# Patient Record
Sex: Male | Born: 1945 | Race: White | Hispanic: No | Marital: Single | Smoking: Never smoker
Health system: Southern US, Community
[De-identification: ages and names within clinical notes are randomized; demographics above are authoritative.]

## PROBLEM LIST (undated history)

## (undated) DIAGNOSIS — Z8709 Personal history of other diseases of the respiratory system: Secondary | ICD-10-CM

## (undated) DIAGNOSIS — M1990 Unspecified osteoarthritis, unspecified site: Secondary | ICD-10-CM

## (undated) DIAGNOSIS — N400 Enlarged prostate without lower urinary tract symptoms: Secondary | ICD-10-CM

## (undated) DIAGNOSIS — M2550 Pain in unspecified joint: Secondary | ICD-10-CM

## (undated) DIAGNOSIS — I10 Essential (primary) hypertension: Secondary | ICD-10-CM

## (undated) DIAGNOSIS — K635 Polyp of colon: Secondary | ICD-10-CM

## (undated) DIAGNOSIS — I2510 Atherosclerotic heart disease of native coronary artery without angina pectoris: Secondary | ICD-10-CM

## (undated) DIAGNOSIS — E785 Hyperlipidemia, unspecified: Secondary | ICD-10-CM

## (undated) HISTORY — DX: Essential (primary) hypertension: I10

## (undated) HISTORY — DX: Polyp of colon: K63.5

## (undated) HISTORY — PX: COLONOSCOPY: SHX174

---

## 2005-07-21 ENCOUNTER — Emergency Department (HOSPITAL_COMMUNITY): Admission: EM | Admit: 2005-07-21 | Discharge: 2005-07-21 | Payer: Self-pay | Admitting: Family Medicine

## 2013-10-21 HISTORY — PX: EYE SURGERY: SHX253

## 2015-08-16 ENCOUNTER — Other Ambulatory Visit (HOSPITAL_COMMUNITY): Payer: Self-pay | Admitting: Orthopedic Surgery

## 2015-09-11 NOTE — Pre-Procedure Instructions (Signed)
Scott MurphyFranklin Johnson  09/11/2015     No Pharmacies Listed   Your procedure is scheduled on Tues, Dec 6 @ 7:30 AM   Report to [facility] Admitting at 5:30 AM   Call this number if you have problems the morning of surgery:  [phone]   Remember:  Do not eat food or drink liquids after midnight .  Take these medicines the morning of surgery with A SIP OF WATER  none              No Goody's,BC's,Aleve,Aspirin,Ibuprofen,Motrin,Advil,Fish Oil,or any Herbal Medications.    Do not wear jewelry.  Do not wear lotions, powders, or colognes.    Men may shave face and neck.   Do not bring valuables to the hospital.  [facility] is not responsible for any belongings or valuables.  Contacts, dentures or bridgework may not be worn into surgery.  Leave your suitcase in the car.  After surgery it may be brought to your room.  For patients admitted to the hospital, discharge time will be determined by your treatment team.     Special instructions:  [place] - Preparing for Surgery  Before surgery, you can play an important role.  Because skin is not sterile, your skin needs to be as free of germs as possible.  You can reduce the number of germs on you skin by washing with CHG (chlorahexidine gluconate) soap before surgery.  CHG is an antiseptic cleaner which kills germs and bonds with the skin to continue killing germs even after washing.  Please DO NOT use if you have an allergy to CHG or antibacterial soaps.  If your skin becomes reddened/irritated stop using the CHG and inform your nurse when you arrive at Short Stay.  Do not shave (including legs and underarms) for at least 48 hours prior to the first CHG shower.  You may shave your face.  Please follow these instructions carefully:   1.  Shower with CHG Soap the night before surgery and the   morning of Surgery.  2.  If you choose to wash your hair, wash your hair first as usual with your  normal shampoo.  3.   After you shampoo, rinse your hair and body thoroughly to remove the  Shampoo.  4.  Use CHG as you would any other liquid soap.  You can apply chg directly to the skin and wash gently with scrungie or a clean washcloth.  5.  Apply the CHG Soap to your body ONLY FROM THE NECK DOWN.        Do not use on open wounds or open sores.  Avoid contact with your eyes  ears, mouth and genitals (private parts).  Wash genitals (private parts  with your normal soap.  6.  Wash thoroughly, paying special attention to the area where your surgery will be performed.  7.  Thoroughly rinse your body with warm water from the neck down.  8.  DO NOT shower/wash with your normal soap after using and rinsing off  the CHG Soap.  9.  Pat yourself dry with a clean towel.            10.  Wear clean pajamas.            11.  Place clean sheets on your bed the night of your first shower and do not sleep with pets.  Day of Surgery  Do not apply any lotions/deoderants the morning of surgery.  Please  wear clean clothes to the hospital/surgery center.    Please read over the following fact sheets that you were given. Pain Booklet, Coughing and Deep Breathing, Blood Transfusion Information, MRSA Information and Surgical Site Infection Prevention

## 2015-09-12 ENCOUNTER — Ambulatory Visit (HOSPITAL_COMMUNITY)
Admission: RE | Admit: 2015-09-12 | Discharge: 2015-09-12 | Disposition: A | Discharge status: Home / Self Care | Payer: BC Managed Care – PPO | Source: Ambulatory Visit | Attending: Orthopedic Surgery | Admitting: Orthopedic Surgery

## 2015-09-12 ENCOUNTER — Encounter (HOSPITAL_COMMUNITY)
Admission: RE | Admit: 2015-09-12 | Discharge: 2015-09-12 | Disposition: A | Discharge status: Home / Self Care | Payer: BC Managed Care – PPO | Source: Ambulatory Visit | Attending: Orthopedic Surgery | Admitting: Orthopedic Surgery

## 2015-09-12 ENCOUNTER — Encounter (HOSPITAL_COMMUNITY): Payer: Self-pay

## 2015-09-12 DIAGNOSIS — Z01812 Encounter for preprocedural laboratory examination: Secondary | ICD-10-CM | POA: Insufficient documentation

## 2015-09-12 DIAGNOSIS — R001 Bradycardia, unspecified: Secondary | ICD-10-CM | POA: Insufficient documentation

## 2015-09-12 DIAGNOSIS — I517 Cardiomegaly: Secondary | ICD-10-CM | POA: Diagnosis not present

## 2015-09-12 DIAGNOSIS — Z01818 Encounter for other preprocedural examination: Secondary | ICD-10-CM

## 2015-09-12 DIAGNOSIS — Z01810 Encounter for preprocedural cardiovascular examination: Secondary | ICD-10-CM | POA: Diagnosis not present

## 2015-09-12 DIAGNOSIS — I498 Other specified cardiac arrhythmias: Secondary | ICD-10-CM | POA: Diagnosis not present

## 2015-09-12 DIAGNOSIS — I10 Essential (primary) hypertension: Secondary | ICD-10-CM | POA: Insufficient documentation

## 2015-09-12 HISTORY — DX: Essential (primary) hypertension: I10

## 2015-09-12 HISTORY — DX: Unspecified osteoarthritis, unspecified site: M19.90

## 2015-09-12 HISTORY — DX: Benign prostatic hyperplasia without lower urinary tract symptoms: N40.0

## 2015-09-12 HISTORY — DX: Hyperlipidemia, unspecified: E78.5

## 2015-09-12 LAB — BASIC METABOLIC PANEL
Anion gap: 9 (ref 5–15)
BUN: 16 mg/dL (ref 6–20)
CALCIUM: 9.7 mg/dL (ref 8.9–10.3)
CO2: 24 mmol/L (ref 22–32)
CREATININE: 1.22 mg/dL (ref 0.61–1.24)
Chloride: 105 mmol/L (ref 101–111)
GFR calc Af Amer: >60 mL/min (ref >60)
GFR, EST NON AFRICAN AMERICAN: 59 mL/min — AB (ref >60)
GLUCOSE: 113 mg/dL — AB (ref 65–99)
Potassium: 3.7 mmol/L (ref 3.5–5.1)
Sodium: 138 mmol/L (ref 135–145)

## 2015-09-12 LAB — URINALYSIS, ROUTINE W REFLEX MICROSCOPIC
BILIRUBIN URINE: NEGATIVE
Glucose, UA: NEGATIVE mg/dL
HGB URINE DIPSTICK: NEGATIVE
Ketones, ur: NEGATIVE mg/dL
Leukocytes, UA: NEGATIVE
Nitrite: NEGATIVE
PH: 5.5 (ref 5.0–8.0)
Protein, ur: NEGATIVE mg/dL
SPECIFIC GRAVITY, URINE: 1.021 (ref 1.005–1.030)

## 2015-09-12 LAB — CBC
HCT: 46.3 % (ref 39.0–52.0)
Hemoglobin: 16.6 g/dL (ref 13.0–17.0)
MCH: 31.9 pg (ref 26.0–34.0)
MCHC: 35.9 g/dL (ref 30.0–36.0)
MCV: 88.9 fL (ref 78.0–100.0)
PLATELETS: 237 10*3/uL (ref 150–400)
RBC: 5.21 MIL/uL (ref 4.22–5.81)
RDW: 13.6 % (ref 11.5–15.5)
WBC: 10.6 10*3/uL — ABNORMAL HIGH (ref 4.0–10.5)

## 2015-09-12 LAB — ABO/RH: ABO/RH(D): O POS

## 2015-09-12 LAB — TYPE AND SCREEN
ABO/RH(D): O POS
ANTIBODY SCREEN: NEGATIVE

## 2015-09-12 LAB — SURGICAL PCR SCREEN
MRSA, PCR: NEGATIVE
STAPHYLOCOCCUS AUREUS: NEGATIVE

## 2015-09-12 NOTE — Progress Notes (Signed)
This patient has scored at an elevated risk for obstructive sleep apnea using the STOP BANG TOOL during a pre-surgical visit. A score of 5 or greater is considered an elevated risk.

## 2015-09-13 LAB — URINE CULTURE: CULTURE: NO GROWTH

## 2015-09-26 ENCOUNTER — Inpatient Hospital Stay (HOSPITAL_COMMUNITY)
Admission: RE | Admit: 2015-09-26 | Discharge: 2015-09-30 | DRG: 470 | Disposition: A | Discharge status: Home Health | Payer: BC Managed Care – PPO | Source: Ambulatory Visit | Attending: Orthopedic Surgery | Admitting: Orthopedic Surgery

## 2015-09-26 ENCOUNTER — Inpatient Hospital Stay (HOSPITAL_COMMUNITY): Payer: BC Managed Care – PPO | Admitting: Certified Registered Nurse Anesthetist

## 2015-09-26 ENCOUNTER — Encounter (HOSPITAL_COMMUNITY)
Admission: RE | Disposition: A | Discharge status: Home Health | Payer: Self-pay | Source: Ambulatory Visit | Attending: Orthopedic Surgery

## 2015-09-26 ENCOUNTER — Encounter (HOSPITAL_COMMUNITY): Payer: Self-pay | Admitting: *Deleted

## 2015-09-26 DIAGNOSIS — M1711 Unilateral primary osteoarthritis, right knee: Secondary | ICD-10-CM | POA: Diagnosis present

## 2015-09-26 DIAGNOSIS — N400 Enlarged prostate without lower urinary tract symptoms: Secondary | ICD-10-CM | POA: Diagnosis present

## 2015-09-26 DIAGNOSIS — Z9842 Cataract extraction status, left eye: Secondary | ICD-10-CM

## 2015-09-26 DIAGNOSIS — Z9841 Cataract extraction status, right eye: Secondary | ICD-10-CM | POA: Diagnosis not present

## 2015-09-26 DIAGNOSIS — Z880 Allergy status to penicillin: Secondary | ICD-10-CM

## 2015-09-26 DIAGNOSIS — I10 Essential (primary) hypertension: Secondary | ICD-10-CM | POA: Diagnosis present

## 2015-09-26 DIAGNOSIS — E785 Hyperlipidemia, unspecified: Secondary | ICD-10-CM | POA: Diagnosis present

## 2015-09-26 HISTORY — PX: TOTAL KNEE ARTHROPLASTY: SHX125

## 2015-09-26 SURGERY — ARTHROPLASTY, KNEE, TOTAL
Anesthesia: Regional | Site: Knee | Laterality: Right

## 2015-09-26 MED ORDER — WARFARIN - PHARMACIST DOSING INPATIENT
Freq: Every day | Status: DC
  Administered 2015-09-26 – 2015-09-29 (×3)

## 2015-09-26 MED ORDER — SODIUM CHLORIDE 0.9 % IR SOLN
Status: DC | PRN
  Administered 2015-09-26: 3000 mL

## 2015-09-26 MED ORDER — OXYCODONE HCL 5 MG PO TABS: 5.0000 mg | ORAL_TABLET | Freq: Once | ORAL | Status: DC | PRN

## 2015-09-26 MED ORDER — HYDROCHLOROTHIAZIDE 25 MG PO TABS
25.0000 mg | ORAL_TABLET | Freq: Every day | ORAL | Status: DC
  Administered 2015-09-27 – 2015-09-30 (×4): 25 mg via ORAL
  Filled 2015-09-26 (×4): qty 1

## 2015-09-26 MED ORDER — WARFARIN VIDEO: Freq: Once | Status: DC

## 2015-09-26 MED ORDER — LIDOCAINE HCL (CARDIAC) 20 MG/ML IV SOLN
INTRAVENOUS | Status: DC | PRN
  Administered 2015-09-26: 60 mg via INTRAVENOUS

## 2015-09-26 MED ORDER — PROPOFOL 10 MG/ML IV BOLUS
INTRAVENOUS | Status: DC | PRN
Start: 2015-09-26 — End: 2015-09-26
  Administered 2015-09-26: 200 mg via INTRAVENOUS

## 2015-09-26 MED ORDER — HYDROMORPHONE HCL 1 MG/ML IJ SOLN
0.2500 mg | INTRAMUSCULAR | Status: DC | PRN
  Administered 2015-09-26: 0.25 mg via INTRAVENOUS

## 2015-09-26 MED ORDER — BUPIVACAINE HCL (PF) 0.5 % IJ SOLN
INTRAMUSCULAR | Status: AC
  Filled 2015-09-26: qty 30

## 2015-09-26 MED ORDER — CLINDAMYCIN PHOSPHATE 600 MG/50ML IV SOLN
600.0000 mg | Freq: Four times a day (QID) | INTRAVENOUS | Status: AC
  Administered 2015-09-26 (×2): 600 mg via INTRAVENOUS
  Filled 2015-09-26 (×3): qty 50

## 2015-09-26 MED ORDER — LOSARTAN POTASSIUM 50 MG PO TABS
100.0000 mg | ORAL_TABLET | Freq: Every day | ORAL | Status: DC
  Administered 2015-09-27 – 2015-09-30 (×4): 100 mg via ORAL
  Filled 2015-09-26 (×4): qty 2

## 2015-09-26 MED ORDER — OXYCODONE HCL 5 MG/5ML PO SOLN: 5.0000 mg | Freq: Once | ORAL | Status: DC | PRN

## 2015-09-26 MED ORDER — MIDAZOLAM HCL 5 MG/5ML IJ SOLN
INTRAMUSCULAR | Status: DC | PRN
  Administered 2015-09-26 (×2): 1 mg via INTRAVENOUS

## 2015-09-26 MED ORDER — ACETAMINOPHEN 325 MG PO TABS
650.0000 mg | ORAL_TABLET | Freq: Four times a day (QID) | ORAL | Status: DC | PRN
  Administered 2015-09-26 – 2015-09-27 (×2): 650 mg via ORAL
  Filled 2015-09-26 (×2): qty 2

## 2015-09-26 MED ORDER — LOSARTAN POTASSIUM-HCTZ 100-25 MG PO TABS: 1.0000 | ORAL_TABLET | Freq: Every day | ORAL | Status: DC

## 2015-09-26 MED ORDER — FENTANYL CITRATE (PF) 250 MCG/5ML IJ SOLN
INTRAMUSCULAR | Status: AC
  Filled 2015-09-26: qty 5

## 2015-09-26 MED ORDER — ACETAMINOPHEN 650 MG RE SUPP: 650.0000 mg | Freq: Four times a day (QID) | RECTAL | Status: DC | PRN

## 2015-09-26 MED ORDER — METHOCARBAMOL 500 MG PO TABS: 500.0000 mg | ORAL_TABLET | Freq: Four times a day (QID) | ORAL | Status: DC | PRN

## 2015-09-26 MED ORDER — FINASTERIDE 5 MG PO TABS
5.0000 mg | ORAL_TABLET | Freq: Every day | ORAL | Status: DC
  Administered 2015-09-27 – 2015-09-30 (×4): 5 mg via ORAL
  Filled 2015-09-26 (×4): qty 1

## 2015-09-26 MED ORDER — CEFAZOLIN SODIUM-DEXTROSE 2-3 GM-% IV SOLR
2.0000 g | INTRAVENOUS | Status: AC
  Administered 2015-09-26: 2 g via INTRAVENOUS
  Filled 2015-09-26: qty 50

## 2015-09-26 MED ORDER — MORPHINE SULFATE (PF) 4 MG/ML IV SOLN: 4.0000 mg | INTRAVENOUS | Status: DC | PRN

## 2015-09-26 MED ORDER — ONDANSETRON HCL 4 MG/2ML IJ SOLN: 4.0000 mg | Freq: Four times a day (QID) | INTRAMUSCULAR | Status: DC | PRN

## 2015-09-26 MED ORDER — POTASSIUM CHLORIDE IN NACL 20-0.9 MEQ/L-% IV SOLN
INTRAVENOUS | Status: AC
  Administered 2015-09-26: 20:00:00 via INTRAVENOUS
  Filled 2015-09-26: qty 1000

## 2015-09-26 MED ORDER — METOCLOPRAMIDE HCL 5 MG/ML IJ SOLN
5.0000 mg | Freq: Three times a day (TID) | INTRAMUSCULAR | Status: DC | PRN
  Administered 2015-09-28: 10 mg via INTRAVENOUS
  Filled 2015-09-26: qty 2

## 2015-09-26 MED ORDER — BUPIVACAINE LIPOSOME 1.3 % IJ SUSP
20.0000 mL | INTRAMUSCULAR | Status: DC
  Filled 2015-09-26: qty 20

## 2015-09-26 MED ORDER — MORPHINE SULFATE (PF) 4 MG/ML IV SOLN
INTRAVENOUS | Status: DC | PRN
  Administered 2015-09-26: 8 mg

## 2015-09-26 MED ORDER — LACTATED RINGERS IV SOLN
INTRAVENOUS | Status: DC | PRN
  Administered 2015-09-26 (×2): via INTRAVENOUS

## 2015-09-26 MED ORDER — DEXTROSE 5 % IV SOLN
500.0000 mg | Freq: Four times a day (QID) | INTRAVENOUS | Status: DC | PRN
  Administered 2015-09-26: 500 mg via INTRAVENOUS
  Filled 2015-09-26 (×2): qty 5

## 2015-09-26 MED ORDER — TRANEXAMIC ACID 1000 MG/10ML IV SOLN
2000.0000 mg | INTRAVENOUS | Status: DC | PRN
  Administered 2015-09-26: 2000 mg via TOPICAL

## 2015-09-26 MED ORDER — METOCLOPRAMIDE HCL 5 MG PO TABS: 5.0000 mg | ORAL_TABLET | Freq: Three times a day (TID) | ORAL | Status: DC | PRN

## 2015-09-26 MED ORDER — TRANEXAMIC ACID 1000 MG/10ML IV SOLN
2000.0000 mg | Freq: Once | INTRAVENOUS | Status: DC
  Filled 2015-09-26: qty 20

## 2015-09-26 MED ORDER — MIDAZOLAM HCL 2 MG/2ML IJ SOLN
INTRAMUSCULAR | Status: AC
  Filled 2015-09-26: qty 2

## 2015-09-26 MED ORDER — BUPIVACAINE-EPINEPHRINE (PF) 0.5% -1:200000 IJ SOLN
INTRAMUSCULAR | Status: DC | PRN
  Administered 2015-09-26: 30 mL via PERINEURAL

## 2015-09-26 MED ORDER — SODIUM CHLORIDE 0.9 % IV SOLN
INTRAVENOUS | Status: DC | PRN
  Administered 2015-09-26: 60 mL

## 2015-09-26 MED ORDER — 0.9 % SODIUM CHLORIDE (POUR BTL) OPTIME
TOPICAL | Status: DC | PRN
  Administered 2015-09-26 (×3): 1000 mL

## 2015-09-26 MED ORDER — SIMVASTATIN 20 MG PO TABS
20.0000 mg | ORAL_TABLET | Freq: Every evening | ORAL | Status: DC
  Administered 2015-09-26 – 2015-09-29 (×4): 20 mg via ORAL
  Filled 2015-09-26 (×4): qty 1

## 2015-09-26 MED ORDER — BUPIVACAINE-EPINEPHRINE (PF) 0.25% -1:200000 IJ SOLN
INTRAMUSCULAR | Status: AC
  Filled 2015-09-26: qty 30

## 2015-09-26 MED ORDER — DOCUSATE SODIUM 100 MG PO CAPS
100.0000 mg | ORAL_CAPSULE | Freq: Two times a day (BID) | ORAL | Status: DC
  Administered 2015-09-26 – 2015-09-30 (×8): 100 mg via ORAL
  Filled 2015-09-26 (×8): qty 1

## 2015-09-26 MED ORDER — BUPIVACAINE-EPINEPHRINE 0.25% -1:200000 IJ SOLN
INTRAMUSCULAR | Status: DC | PRN
  Administered 2015-09-26: 10 mL

## 2015-09-26 MED ORDER — OXYCODONE HCL 5 MG PO TABS
5.0000 mg | ORAL_TABLET | ORAL | Status: DC | PRN
  Administered 2015-09-26 – 2015-09-29 (×18): 10 mg via ORAL
  Filled 2015-09-26 (×18): qty 2

## 2015-09-26 MED ORDER — MENTHOL 3 MG MT LOZG: 1.0000 | LOZENGE | OROMUCOSAL | Status: DC | PRN

## 2015-09-26 MED ORDER — PATIENT'S GUIDE TO USING COUMADIN BOOK
Freq: Once | Status: DC
  Filled 2015-09-26: qty 1

## 2015-09-26 MED ORDER — GLYCOPYRROLATE 0.2 MG/ML IJ SOLN
INTRAMUSCULAR | Status: DC | PRN
  Administered 2015-09-26: 0.2 mg via INTRAVENOUS

## 2015-09-26 MED ORDER — ROCURONIUM BROMIDE 100 MG/10ML IV SOLN
INTRAVENOUS | Status: DC | PRN
  Administered 2015-09-26: 50 mg via INTRAVENOUS

## 2015-09-26 MED ORDER — HYDROMORPHONE HCL 1 MG/ML IJ SOLN
INTRAMUSCULAR | Status: AC
  Filled 2015-09-26: qty 1

## 2015-09-26 MED ORDER — ONDANSETRON HCL 4 MG/2ML IJ SOLN
INTRAMUSCULAR | Status: DC | PRN
  Administered 2015-09-26: 4 mg via INTRAVENOUS

## 2015-09-26 MED ORDER — ONDANSETRON HCL 4 MG PO TABS: 4.0000 mg | ORAL_TABLET | Freq: Four times a day (QID) | ORAL | Status: DC | PRN

## 2015-09-26 MED ORDER — PROPOFOL 10 MG/ML IV BOLUS
INTRAVENOUS | Status: AC
  Filled 2015-09-26: qty 20

## 2015-09-26 MED ORDER — MORPHINE SULFATE (PF) 4 MG/ML IV SOLN
INTRAVENOUS | Status: AC
  Filled 2015-09-26: qty 2

## 2015-09-26 MED ORDER — CHLORHEXIDINE GLUCONATE 4 % EX LIQD: 60.0000 mL | Freq: Once | CUTANEOUS | Status: DC

## 2015-09-26 MED ORDER — WARFARIN SODIUM 5 MG PO TABS
10.0000 mg | ORAL_TABLET | Freq: Once | ORAL | Status: AC
  Administered 2015-09-26: 10 mg via ORAL
  Filled 2015-09-26: qty 2

## 2015-09-26 MED ORDER — CLONIDINE HCL (ANALGESIA) 100 MCG/ML EP SOLN
EPIDURAL | Status: DC | PRN
  Administered 2015-09-26: 1 mL via INTRA_ARTICULAR

## 2015-09-26 MED ORDER — FENTANYL CITRATE (PF) 100 MCG/2ML IJ SOLN
INTRAMUSCULAR | Status: DC | PRN
  Administered 2015-09-26 (×8): 50 ug via INTRAVENOUS

## 2015-09-26 MED ORDER — PHENYLEPHRINE HCL 10 MG/ML IJ SOLN
INTRAMUSCULAR | Status: DC | PRN
  Administered 2015-09-26 (×3): 80 ug via INTRAVENOUS

## 2015-09-26 MED ORDER — PHENOL 1.4 % MT LIQD: 1.0000 | OROMUCOSAL | Status: DC | PRN

## 2015-09-26 SURGICAL SUPPLY — 82 items
BANDAGE ELASTIC 4 VELCRO ST LF (GAUZE/BANDAGES/DRESSINGS) ×1 IMPLANT
BANDAGE ESMARK 6X9 LF (GAUZE/BANDAGES/DRESSINGS) ×1 IMPLANT
BLADE SAG 18X100X1.27 (BLADE) ×3 IMPLANT
BLADE SAW SGTL 13.0X1.19X90.0M (BLADE) IMPLANT
BLADE SURG 10 STRL SS (BLADE) ×2 IMPLANT
BNDG CMPR 9X6 STRL LF SNTH (GAUZE/BANDAGES/DRESSINGS) ×1
BNDG CMPR MED 10X6 ELC LF (GAUZE/BANDAGES/DRESSINGS) ×1
BNDG COHESIVE 6X5 TAN STRL LF (GAUZE/BANDAGES/DRESSINGS) ×3 IMPLANT
BNDG ELASTIC 6X10 VLCR STRL LF (GAUZE/BANDAGES/DRESSINGS) ×5 IMPLANT
BNDG ESMARK 6X9 LF (GAUZE/BANDAGES/DRESSINGS) ×3
BOWL SMART MIX CTS (DISPOSABLE) ×2 IMPLANT
CAPT KNEE TOTAL 3 ×2 IMPLANT
CEMENT BONE SIMPLEX SPEEDSET (Cement) ×4 IMPLANT
CLOSURE WOUND 1/2 X4 (GAUZE/BANDAGES/DRESSINGS) ×1
COVER SURGICAL LIGHT HANDLE (MISCELLANEOUS) ×3 IMPLANT
CUFF TOURNIQUET SINGLE 34IN LL (TOURNIQUET CUFF) ×3 IMPLANT
CUFF TOURNIQUET SINGLE 44IN (TOURNIQUET CUFF) IMPLANT
DECANTER SPIKE VIAL GLASS SM (MISCELLANEOUS) ×3 IMPLANT
DRAPE INCISE IOBAN 66X45 STRL (DRAPES) ×2 IMPLANT
DRAPE ORTHO SPLIT 77X108 STRL (DRAPES) ×9
DRAPE SURG ORHT 6 SPLT 77X108 (DRAPES) ×3 IMPLANT
DRAPE U-SHAPE 47X51 STRL (DRAPES) ×3 IMPLANT
DRSG MEPILEX BORDER 4X12 (GAUZE/BANDAGES/DRESSINGS) ×2 IMPLANT
DRSG MEPILEX BORDER 4X8 (GAUZE/BANDAGES/DRESSINGS) ×3 IMPLANT
DRSG PAD ABDOMINAL 8X10 ST (GAUZE/BANDAGES/DRESSINGS) ×4 IMPLANT
DURAPREP 26ML APPLICATOR (WOUND CARE) ×3 IMPLANT
ELECT REM PT RETURN 9FT ADLT (ELECTROSURGICAL) ×3
ELECTRODE REM PT RTRN 9FT ADLT (ELECTROSURGICAL) ×1 IMPLANT
FACESHIELD WRAPAROUND (MASK) IMPLANT
FACESHIELD WRAPAROUND OR TEAM (MASK) ×1 IMPLANT
GAUZE SPONGE 4X4 12PLY STRL (GAUZE/BANDAGES/DRESSINGS) ×3 IMPLANT
GAUZE XEROFORM 1X8 LF (GAUZE/BANDAGES/DRESSINGS) ×3 IMPLANT
GAUZE XEROFORM 5X9 LF (GAUZE/BANDAGES/DRESSINGS) ×1 IMPLANT
GLOVE BIOGEL PI IND STRL 7.5 (GLOVE) ×1 IMPLANT
GLOVE BIOGEL PI IND STRL 8 (GLOVE) ×2 IMPLANT
GLOVE BIOGEL PI INDICATOR 7.5 (GLOVE) ×2
GLOVE BIOGEL PI INDICATOR 8 (GLOVE) ×4
GLOVE ECLIPSE 7.0 STRL STRAW (GLOVE) ×6 IMPLANT
GLOVE SURG ORTHO 8.0 STRL STRW (GLOVE) ×6 IMPLANT
GOWN STRL REUS W/ TWL LRG LVL3 (GOWN DISPOSABLE) ×3 IMPLANT
GOWN STRL REUS W/TWL 2XL LVL3 (GOWN DISPOSABLE) ×3 IMPLANT
GOWN STRL REUS W/TWL LRG LVL3 (GOWN DISPOSABLE) ×9
HANDPIECE INTERPULSE COAX TIP (DISPOSABLE) ×3
HOOD PEEL AWAY FACE SHEILD DIS (HOOD) ×8 IMPLANT
IMMOBILIZER KNEE 20 (SOFTGOODS) IMPLANT
IMMOBILIZER KNEE 22 UNIV (SOFTGOODS) ×3 IMPLANT
IMMOBILIZER KNEE 24 THIGH 36 (MISCELLANEOUS) IMPLANT
IMMOBILIZER KNEE 24 UNIV (MISCELLANEOUS)
KIT BASIN OR (CUSTOM PROCEDURE TRAY) ×3 IMPLANT
KIT ROOM TURNOVER OR (KITS) ×3 IMPLANT
MANIFOLD NEPTUNE II (INSTRUMENTS) ×3 IMPLANT
NDL 18GX1X1/2 (RX/OR ONLY) (NEEDLE) ×1 IMPLANT
NDL SAFETY ECLIPSE 18X1.5 (NEEDLE) ×1 IMPLANT
NDL SPNL 18GX3.5 QUINCKE PK (NEEDLE) ×1 IMPLANT
NEEDLE 18GX1X1/2 (RX/OR ONLY) (NEEDLE) ×6 IMPLANT
NEEDLE HYPO 18GX1.5 SHARP (NEEDLE) ×3
NEEDLE HYPO 22GX1.5 SAFETY (NEEDLE) ×6 IMPLANT
NEEDLE SPNL 18GX3.5 QUINCKE PK (NEEDLE) ×3 IMPLANT
NS IRRIG 1000ML POUR BTL (IV SOLUTION) ×4 IMPLANT
PACK TOTAL JOINT (CUSTOM PROCEDURE TRAY) ×3 IMPLANT
PACK UNIVERSAL I (CUSTOM PROCEDURE TRAY) ×3 IMPLANT
PAD ARMBOARD 7.5X6 YLW CONV (MISCELLANEOUS) ×6 IMPLANT
PAD CAST 4YDX4 CTTN HI CHSV (CAST SUPPLIES) ×1 IMPLANT
PADDING CAST COTTON 4X4 STRL (CAST SUPPLIES) ×3
PADDING CAST COTTON 6X4 STRL (CAST SUPPLIES) ×5 IMPLANT
SET HNDPC FAN SPRY TIP SCT (DISPOSABLE) IMPLANT
SPONGE LAP 18X18 X RAY DECT (DISPOSABLE) IMPLANT
STEM CEMENTED TRIATHLON (Stem) ×2 IMPLANT
STRIP CLOSURE SKIN 1/2X4 (GAUZE/BANDAGES/DRESSINGS) ×3 IMPLANT
SUCTION FRAZIER TIP 10 FR DISP (SUCTIONS) ×3 IMPLANT
SUT MNCRL AB 3-0 PS2 18 (SUTURE) ×3 IMPLANT
SUT VIC AB 0 CT1 27 (SUTURE) ×12
SUT VIC AB 0 CT1 27XBRD ANBCTR (SUTURE) ×3 IMPLANT
SUT VIC AB 1 CT1 27 (SUTURE) ×12
SUT VIC AB 1 CT1 27XBRD ANBCTR (SUTURE) ×5 IMPLANT
SUT VIC AB 2-0 CT1 27 (SUTURE) ×12
SUT VIC AB 2-0 CT1 TAPERPNT 27 (SUTURE) ×2 IMPLANT
SYR 30ML LL (SYRINGE) ×9 IMPLANT
SYR TB 1ML LUER SLIP (SYRINGE) ×3 IMPLANT
TOWEL OR 17X24 6PK STRL BLUE (TOWEL DISPOSABLE) ×6 IMPLANT
TOWEL OR 17X26 10 PK STRL BLUE (TOWEL DISPOSABLE) ×6 IMPLANT
WATER STERILE IRR 1000ML POUR (IV SOLUTION) ×2 IMPLANT

## 2015-09-26 NOTE — Progress Notes (Signed)
Orthopedic Tech Progress Note Patient Details:  Scott Johnson Below 09/17/1946 [id number]      Scott Johnson, [name] 09/26/2015, 2:12 PM

## 2015-09-26 NOTE — Progress Notes (Signed)
Orthopedic Tech Progress Note Patient Details:  Scott MurphyFranklin Johnson 06/30/1946 [id number]  CPM Right Knee CPM Right Knee: On Right Knee Flexion (Degrees): 50 Right Knee Extension (Degrees): 0 Ortho will provide ohf when one becomes available  [name], Shanele Nissan 09/26/2015, 11:17 AM

## 2015-09-26 NOTE — Evaluation (Signed)
Physical Therapy Evaluation Patient Details Name: Scott Johnson MRN: [id number] DOB: 1946/09/24 Today's Date: 09/26/2015   History of Present Illness  Patient is a 69 y/o male s/p Rt TKA. PMH includes HTN, HLD, arthritis.  Clinical Impression  Patient presents with pain, nausea and post surgical deficits RLE s/p R TKA. Tolerated SPT and short distance ambulation to chair with Min A-Min guard assist for safety. Instructed pt in exercises. Pt lives alone and does not have support at home. Would benefit from ST SNF to maximize independence and mobility prior to return home.    Follow Up Recommendations SNF    Equipment Recommendations  Other (comment) (defer to next venue)    Recommendations for Other Services OT consult     Precautions / Restrictions Precautions Precautions: Knee Precaution Booklet Issued: No Precaution Comments: Reviewed no pillow under knee and precautions Restrictions Weight Bearing Restrictions: Yes RLE Weight Bearing: Weight bearing as tolerated      Mobility  Bed Mobility Overal bed mobility: Needs Assistance Bed Mobility: Supine to Sit     Supine to sit: Supervision;HOB elevated     General bed mobility comments: Use of rail for support. No assist needed. + nausea.  Transfers Overall transfer level: Needs assistance Equipment used: Rolling walker (2 wheeled) Transfers: Sit to/from [employer] Sit to Stand: Min assist Stand pivot transfers: Min assist       General transfer comment: Multiple attempts to stand from EOB without success, requirng Min A to boost up and cues for hand placement/technique. SPT bed to [facility]. Transferred to chair post ambulation.  Ambulation/Gait Ambulation/Gait assistance: Min guard Ambulation Distance (Feet): 5 Feet Assistive device: Rolling walker (2 wheeled) Gait Pattern/deviations: Step-to pattern;Decreased stance time - right;Decreased step length - left;Trunk flexed   Gait velocity  interpretation: <1.8 ft/sec, indicative of risk for recurrent falls General Gait Details: Able to take a few steps to chair with cues for RW management. + nausea- resolved after sitting in chair a few minutes.   Stairs            Wheelchair Mobility    Modified Rankin (Stroke Patients Only)       Balance Overall balance assessment: Needs assistance Sitting-balance support: Feet supported;No upper extremity supported Sitting balance-Leahy Scale: Good     Standing balance support: During functional activity Standing balance-Leahy Scale: Poor Standing balance comment: Relient on RW for support.                              Pertinent Vitals/Pain Pain Assessment: Faces Faces Pain Scale: Hurts a little bit Pain Location: right knee Pain Descriptors / Indicators: Sore Pain Intervention(s): Monitored during session;Repositioned    Home Living Family/patient expects to be discharged to:: Skilled nursing facility Living Arrangements: Alone   Type of Home: House Home Access: Level entry     Home Layout: One level Home Equipment: None      Prior Function Level of Independence: Independent         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[employer]        Extremity/Trunk Assessment   Upper Extremity Assessment: Defer to OT evaluation           Lower Extremity Assessment: RLE deficits/detail RLE Deficits / Details: Limited AROM/strength secondary to surgery/pain. Able to perform partial LAQ.       Communication   Communication: No difficulties  Cognition Arousal/Alertness: Awake/alert Behavior During Therapy: WFL for  tasks assessed/performed Overall Cognitive Status: Within Functional Limits for tasks assessed                      General Comments      Exercises Total Joint Exercises Ankle Circles/Pumps: Both;10 reps;Supine Quad Sets: Both;10 reps;Supine Gluteal Sets: Both;10 reps;Supine      Assessment/Plan    PT  Assessment Patient needs continued PT services  PT Diagnosis Difficulty walking;Acute pain   PT Problem List Decreased strength;Pain;Decreased activity tolerance;Decreased balance;Decreased mobility;Decreased range of motion;Decreased knowledge of use of DME  PT Treatment Interventions Balance training;Gait training;Functional mobility training;Therapeutic activities;Therapeutic exercise;Patient/family education   PT Goals (Current goals can be found in the Care Plan section) Acute Rehab PT Goals Patient Stated Goal: to be 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 PT Goal Formulation: With patient Time For Goal Achievement: 10/10/15 Potential to Achieve Goals: Good    Frequency 7X/week   Barriers to discharge Decreased caregiver support LIves alone    Co-evaluation               End of Session Equipment Utilized During Treatment: Gait belt Activity Tolerance: Patient tolerated treatment well Patient left: in chair;with call bell/phone within reach Nurse Communication: Mobility status         Time: [id number] PT Time Calculation (min) (ACUTE ONLY): 24 min   Charges:   PT Evaluation $Initial PT Evaluation Tier I: 1 Procedure PT Treatments $Therapeutic Activity: 8-22 mins   PT G Codes:        Layloni Fahrner A Trelyn Vanderlinde 09/26/2015, 3:14 PM Mylo RedShauna Elvin Banker, PT, DPT [phone]

## 2015-09-26 NOTE — Progress Notes (Signed)
Utilization review completed.  

## 2015-09-26 NOTE — H&P (Signed)
TOTAL KNEE ADMISSION H&P  Patient is being admitted for right total knee arthroplasty.  Subjective:  Chief Complaint:right knee pain.  HPI: Scott MurphyFranklin Johnson, 69 y.o. male, has a history of pain and functional disability in the right knee due to arthritis and has failed non-surgical conservative treatments for greater than 12 weeks to includeNSAID's and/or analgesics, corticosteriod injections, viscosupplementation injections, flexibility and strengthening excercises, use of assistive devices and activity modification.  Onset of symptoms was gradual, starting >10 years ago with gradually worsening course since that time. The patient noted no past surgery on the right knee(s).  Patient currently rates pain in the right knee(s) at 9 out of 10 with activity. Patient has night pain, worsening of pain with activity and weight bearing, pain that interferes with activities of daily living, pain with passive range of motion, crepitus and joint swelling.  Patient has evidence of subchondral sclerosis, joint subluxation and joint space narrowing by imaging studies. This patient has had worsening of symptoms over past year. There is no active infection.  There are no active problems to display for this patient.  Past Medical History  Diagnosis Date  . Hypertension   . Arthritis   . Benign prostate hyperplasia   . Hyperlipidemia     Past Surgical History  Procedure Laterality Date  . Eye surgery Bilateral 2015    Cataract removal    Prescriptions prior to admission  Medication Sig Dispense Refill Last Dose  . finasteride (PROSCAR) 5 MG tablet Take 5 mg by mouth daily.  3 09/25/2015 at Unknown time  . losartan-hydrochlorothiazide (HYZAAR) 100-25 MG tablet Take 1 tablet by mouth daily.  1 09/25/2015 at Unknown time  . simvastatin (ZOCOR) 20 MG tablet Take 20 mg by mouth every evening.  3 09/25/2015 at Unknown time   Allergies  Allergen Reactions  . Penicillins Rash    On body    Social History   Substance Use Topics  . Smoking status: Never Smoker   . Smokeless tobacco: Not on file  . Alcohol Use: No    History reviewed. No pertinent family history.   ROS  Objective:  Physical Exam  Vital signs in last 24 hours: Temp:  [97.9 F (36.6 C)] 97.9 F (36.6 C) (12/06 0607) Pulse Rate:  [79] 79 (12/06 0607) Resp:  [16] 16 (12/06 0607) BP: (150)/(61) 150/61 mmHg (12/06 0607) SpO2:  [96 %] 96 % (12/06 24400607)  Labs:   There is no weight on file to calculate BMI.   Imaging Review Plain radiographs demonstrate severe degenerative joint disease of the right knee(s). The overall alignment ismild varus. The bone quality appears to be good for age and reported activity level.  Assessment/Plan:  End stage arthritis, right knee   The patient history, physical examination, clinical judgment of the provider and imaging studies are consistent with end stage degenerative joint disease of the right knee(s) and total knee arthroplasty is deemed medically necessary. The treatment options including medical management, injection therapy arthroscopy and arthroplasty were discussed at length. The risks and benefits of total knee arthroplasty were presented and reviewed. The risks due to aseptic loosening, infection, stiffness, patella tracking problems, thromboembolic complications and other imponderables were discussed. The patient acknowledged the explanation, agreed to proceed with the plan and consent was signed. Patient is being admitted for inpatient treatment for surgery, pain control, PT, OT, prophylactic antibiotics, VTE prophylaxis, progressive ambulation and ADL's and discharge planning. The patient is planning to be discharged home with home health services

## 2015-09-26 NOTE — Brief Op Note (Signed)
09/26/2015  10:25 AM  PATIENT:  Scott Johnson  69 y.o. male  PRE-OPERATIVE DIAGNOSIS:  RIGHT KNEE OSTEOARTHRITIS  POST-OPERATIVE DIAGNOSIS:  RIGHT KNEE OSTEOARTHRITIS  PROCEDURE:  Procedure(s): TOTAL KNEE ARTHROPLASTY, CEMENTED  SURGEON:  Surgeon(s): Cammy CopaScott Gregory Dean, MD  ASSISTANT: Patrick Jupiterarla Bethune RNFA  ANESTHESIA:   general  EBL: 75 ml    Total I/O In: 1000 [I.V.:1000] Out: 150 [Blood:150]  BLOOD ADMINISTERED: none  DRAINS: none   LOCAL MEDICATIONS USED:  exparel Marcaine morphine clonidine  SPECIMEN:  No Specimen  COUNTS:  YES  TOURNIQUET:   Total Tourniquet Time Documented: Thigh (Right) - 100 minutes Total: Thigh (Right) - 100 minutes   DICTATION: .Other Dictation: Dictation Number [id number]  PLAN OF CARE: Admit to inpatient   PATIENT DISPOSITION:  PACU - hemodynamically stable              2

## 2015-09-26 NOTE — Progress Notes (Signed)
ANTICOAGULATION CONSULT NOTE - Initial Consult  Pharmacy Consult for Coumadin Indication: VTE prophylaxis  Allergies  Allergen Reactions  . Penicillins Rash    On body   Labs: No results for input(s): HGB, HCT, PLT, APTT, LABPROT, INR, HEPARINUNFRC, CREATININE, CKTOTAL, CKMB, TROPONINI in the last 72 hours.  CrCl cannot be calculated (Unknown ideal weight.).   Medical History: Past Medical History  Diagnosis Date  . Hypertension   . Arthritis   . Benign prostate hyperplasia   . Hyperlipidemia     Assessment: 69 year old male s/p TKA to begin Coumadin for VTE prophylaxis  Goal of Therapy:  INR 2-3 Monitor platelets by anticoagulation protocol: Yes   Plan:  Coumadin 10 mg po x 1 Daily INR Coumadin education  Thank you. Okey RegalLisa Kaytlyn Din, PharmD [phone]  Elwin SleightPowell, Wash Nienhaus Kay 09/26/2015,12:18 PM

## 2015-09-26 NOTE — Transfer of Care (Signed)
Immediate Anesthesia Transfer of Care Note  Patient: Scott MurphyFranklin Johnson  Procedure(s) Performed: Procedure(s): TOTAL KNEE ARTHROPLASTY, CEMENTED (Right)  Patient Location: PACU  Anesthesia Type:General and Regional  Level of Consciousness: awake and confused  Airway & Oxygen Therapy: Patient Spontanous Breathing and Patient connected to nasal cannula oxygen  Post-op Assessment: Report given to RN, Post -op Vital signs reviewed and stable, Patient moving all extremities and Patient moving all extremities X 4  Post vital signs: Reviewed and stable  Last Vitals:  Filed Vitals:   09/26/15 0607  BP: 150/61  Pulse: 79  Temp: 36.6 C  Resp: 16    Complications: No apparent anesthesia complications

## 2015-09-26 NOTE — Progress Notes (Signed)
Orthopedic Tech Progress Note Patient Details:  Scott Johnson 10/27/1945 [id number]  CPM Right Knee CPM Right Knee: On Right Knee Flexion (Degrees): 50 Right Knee Extension (Degrees): 0 Additional Comments: trapeze bar patient helper   [name], Jelisa [place] 09/26/2015, 2:12 PM

## 2015-09-26 NOTE — Anesthesia Preprocedure Evaluation (Signed)
Anesthesia Evaluation  Patient identified by MRN, date of birth, ID band Patient awake    Reviewed: Allergy & Precautions, NPO status , Patient's Chart, lab work & pertinent test results  Airway Mallampati: II   Neck ROM: full    Dental   Pulmonary neg pulmonary ROS,    breath sounds clear to auscultation       Cardiovascular hypertension,  Rhythm:regular Rate:Normal     Neuro/Psych    GI/Hepatic   Endo/Other  obese  Renal/GU      Musculoskeletal  (+) Arthritis ,   Abdominal   Peds  Hematology   Anesthesia Other Findings   Reproductive/Obstetrics                             Anesthesia Physical Anesthesia Plan  ASA: II  Anesthesia Plan: General and Regional   Post-op Pain Management: MAC Combined w/ Regional for Post-op pain   Induction: Intravenous  Airway Management Planned: Oral ETT  Additional Equipment:   Intra-op Plan:   Post-operative Plan: Extubation in OR  Informed Consent: I have reviewed the patients History and Physical, chart, labs and discussed the procedure including the risks, benefits and alternatives for the proposed anesthesia with the patient or authorized representative who has indicated his/her understanding and acceptance.     Plan Discussed with: CRNA, Anesthesiologist and Surgeon  Anesthesia Plan Comments:         Anesthesia Quick Evaluation

## 2015-09-26 NOTE — Anesthesia Procedure Notes (Addendum)
Procedure Name: Intubation Date/Time: 09/26/2015 7:38 AM Performed by: Adonis HousekeeperNGELL, JANNA M Pre-anesthesia Checklist: Patient identified, Emergency Drugs available, Suction available and Patient being monitored Patient Re-evaluated:Patient Re-evaluated prior to inductionOxygen Delivery Method: Circle system utilized Preoxygenation: Pre-oxygenation with 100% oxygen Intubation Type: IV induction Ventilation: Mask ventilation without difficulty, Two handed mask ventilation required and Oral airway inserted - appropriate to patient size Laryngoscope Size: Mac and 3 (crna dl #1 grade 4 view; MD dl #2 grade 3 view ) Tube type: Oral Tube size: 7.5 mm Number of attempts: 2 Airway Equipment and Method: Stylet Placement Confirmation: ETT inserted through vocal cords under direct vision,  positive ETCO2 and breath sounds checked- equal and bilateral Secured at: 22 cm Tube secured with: Tape Dental Injury: Teeth and Oropharynx as per pre-operative assessment    Anesthesia Regional Block:  Adductor canal block  Pre-Anesthetic Checklist: ,, timeout performed, Correct Patient, Correct Site, Correct Laterality, Correct Procedure, Correct Position, site marked, Risks and benefits discussed,  Surgical consent,  Pre-op evaluation,  At surgeon's request and post-op pain management  Laterality: Right  Prep: chloraprep       Needles:  Injection technique: Single-shot  Needle Type: Echogenic Needle     Needle Length: 9cm 9 cm Needle Gauge: 21 and 21 G    Additional Needles:  Procedures: ultrasound guided (picture in chart) Adductor canal block Narrative:  Start time: 09/26/2015 7:00 AM End time: 09/26/2015 8:10 AM Injection made incrementally with aspirations every 5 mL.  Performed by: Personally  Anesthesiologist: Abagayle Klutts  Additional Notes: Pt tolerated the procedure well.

## 2015-09-26 NOTE — Progress Notes (Signed)
Orthopedic Tech Progress Note Patient Details:  Soyla MurphyFranklin Roettger 08/30/1946 [id number] On cpm at 1900 Patient ID: Soyla MurphyFranklin Elwell, male   DOB: 11/30/1945, 69 y.o.   MRN: [id number]   Jennye MoccasinHughes, Cienna Dumais Craig 09/26/2015, 7:07 PM

## 2015-09-27 ENCOUNTER — Encounter (HOSPITAL_COMMUNITY): Payer: Self-pay | Admitting: General Practice

## 2015-09-27 LAB — BASIC METABOLIC PANEL
Anion gap: 9 (ref 5–15)
BUN: 13 mg/dL (ref 6–20)
CALCIUM: 8.4 mg/dL — AB (ref 8.9–10.3)
CHLORIDE: 101 mmol/L (ref 101–111)
CO2: 26 mmol/L (ref 22–32)
CREATININE: 1.23 mg/dL (ref 0.61–1.24)
GFR calc non Af Amer: 58 mL/min — ABNORMAL LOW (ref >60)
GLUCOSE: 131 mg/dL — AB (ref 65–99)
Potassium: 3.6 mmol/L (ref 3.5–5.1)
Sodium: 136 mmol/L (ref 135–145)

## 2015-09-27 LAB — CBC
HEMATOCRIT: 38.9 % — AB (ref 39.0–52.0)
HEMOGLOBIN: 13.3 g/dL (ref 13.0–17.0)
MCH: 30.8 pg (ref 26.0–34.0)
MCHC: 34.2 g/dL (ref 30.0–36.0)
MCV: 90 fL (ref 78.0–100.0)
Platelets: 180 10*3/uL (ref 150–400)
RBC: 4.32 MIL/uL (ref 4.22–5.81)
RDW: 13.7 % (ref 11.5–15.5)
WBC: 11.4 10*3/uL — ABNORMAL HIGH (ref 4.0–10.5)

## 2015-09-27 LAB — PROTIME-INR
INR: 1.31 (ref 0.00–1.49)
Prothrombin Time: 16.4 seconds — ABNORMAL HIGH (ref 11.6–15.2)

## 2015-09-27 MED ORDER — WARFARIN SODIUM 5 MG PO TABS
10.0000 mg | ORAL_TABLET | Freq: Once | ORAL | Status: AC
  Administered 2015-09-27: 10 mg via ORAL
  Filled 2015-09-27: qty 2

## 2015-09-27 NOTE — Discharge Instructions (Signed)

## 2015-09-27 NOTE — Op Note (Signed)
NAME:  Scott Johnson, Tyhir          ACCOUNT NO.:  [id number]  MEDICAL RECORD NO.:  [id number]  LOCATION:  [id number]                        FACILITY:  [facility]  PHYSICIAN:  Burnard BuntingG. Scott Rommie Dunn, M.D.    DATE OF BIRTH:  Jul 01, 1946  DATE OF PROCEDURE: DATE OF DISCHARGE:                              OPERATIVE REPORT   PREOPERATIVE DIAGNOSIS:  Right knee arthritis.  POSTOPERATIVE DIAGNOSIS:  Right knee arthritis.  PROCEDURE:  Right total knee replacement.  SURGEON:  Burnard BuntingG. Scott Ceanna Wareing, M.D.  ASSISTANT:  Patrick Jupiterarla Bethune, RNFA  INDICATIONS:  Scott Johnson is a 69 year old patient, right knee arthritis, presents for operative management after failure to conservative management and explanation of risks and benefits.  COMPONENTS UTILIZED:  Cemented posterior cruciate sacrificing [employer] Triathlon 5 femur, 6 tibia, 9-mm poly insert and 35-mm 3-pegged patella.  PROCEDURE IN DETAIL:  The patient was brought to the operating room where general anesthetic was introduced.  Preoperative antibiotics were administered.  Time-out was called.  Right leg was prescrubbed with alcohol and Betadine, and allowed to air dry, prepped with DuraPrep solution and draped in a sterile manner.  Collier Flowersoban was used to cover the operative field.  Time-out was called.  Preop range of motion, 15 degrees flexion contracture to about 95 degrees of flexion.  The leg was elevated, exsanguinated with the Esmarch wrap, tourniquet was inflated. Total tourniquet time about 100 minutes at 300 mmHg.  Anterior approach to the knee was made.  Skin and subcutaneous tissue were sharply divided.  Medial parapatellar approach was made to the knee.  Precise location marked with #1 Vicryl suture.  The soft tissue elevated off the anterior distal femur.  Periosteal sleeve was elevated around the proximal medial tibia.  ACL and PCL released.  Fat pad partially excised.  Lateral patellofemoral ligament released.  Patella was everted, intramedullary  alignment was used to cut the tibia, initial cut 9 mm off the least affected tibial plateau perpendicular to the mechanical axis, 2 more mm resection was required.  Femoral cut performed with intramedullary alignment and a 5 degrees of valgus.  An 8- mm resection initially made, 2 more mm required because of the patient's preop flexion contracture.  After these resections were made with the collaterals and posterior structures were protected, the knee did achieve full extension with a 9-mm spacer in place.  Chamfer, anterior and posterior cuts were made with the collaterals protected.  Box cut performed.  Tibia keel punched to accept a 50-mm stem as well as at the size 6 tibial tray.  Meniscal remnants were resected.  Trial components placed in full extension.  The patella was then cut freehand from 23 down to 13 and 35, 3-pegged patella was placed.  With trial components in position, the patient achieved full extension and had excellent stability of varus-valgus stress at 0, 30 and 90 degrees.  Patellar tracking excellent with no thumbs technique.  Trial components were removed.  Three liters of irrigating solution performed, Exparel injected into the capsule and fat pad region, changing the cast and then allowed to sit within the incision for 3 minutes.  Components were then cemented into position.  Same stability parameters were maintained. Tourniquet was released.  Bleeding points were  encountered and controlled using electrocautery.  Thorough irrigation was again performed.  Incision was then closed over bolster using #1 Vicryl suture, 0 Vicryl suture, 2-0 Vicryl suture and 3-0 Monocryl. Solution of Marcaine, morphine, and clonidine injected into the knee at the conclusion of the case.  The patient tolerated the procedure well without immediate complication.     Burnard Bunting, M.D.     GSD/MEDQ  D:  09/26/2015  T:  09/27/2015  Job:  [id number]

## 2015-09-27 NOTE — Progress Notes (Signed)
Subjective: Pt stable - pain ok   Objective: Vital signs in last 24 hours: Temp:  [97.2 F (36.2 C)-98.5 F (36.9 C)] 98.4 F (36.9 C) (12/07 0220) Pulse Rate:  [52-70] 70 (12/07 0220) Resp:  [14-22] 18 (12/07 0220) BP: (121-138)/(54-76) 138/56 mmHg (12/07 0220) SpO2:  [93 %-99 %] 93 % (12/07 0220)  Intake/Output from previous day: 12/06 0701 - 12/07 0700 In: 2318.8 [P.O.:240; I.V.:2028.8; IV Piggyback:50] Out: 150 [Blood:150] Intake/Output this shift:    Exam:  Sensation intact distally Intact pulses distally Dorsiflexion/Plantar flexion intact  Labs:  Recent Labs  09/27/15 0516  HGB 13.3    Recent Labs  09/27/15 0516  WBC 11.4*  RBC 4.32  HCT 38.9*  PLT 180    Recent Labs  09/27/15 0516  NA 136  K 3.6  CL 101  CO2 26  BUN 13  CREATININE 1.23  GLUCOSE 131*  CALCIUM 8.4*    Recent Labs  09/27/15 0516  INR 1.31    Assessment/Plan: Pt mobilizing well so far - more PT and CPM today   Antoneo Ghrist SCOTT 09/27/2015, 7:20 AM

## 2015-09-27 NOTE — Evaluation (Signed)
Occupational Therapy Evaluation Patient Details Name: Scott Johnson Novell MRN: [id number] DOB: 11/23/1945 Today's Date: 09/27/2015    History of Present Illness Patient is a 69 y/o male s/p Rt TKA. PMH includes HTN, HLD, arthritis.   Clinical Impression   Pt reports he was independent with ADLs PTA. Currently pt is overall min guard for ADLs and mobility. Began ADL and safety education; pt verbalized understanding. Pt lives alone and does not have any family support in the area, therefore, he is concerned about returning home prior to being at a mod I level. At this time, recommending SNF for further rehab prior to d/c home. Pt has the potential to progress to being able to go straight home; if he does he will require home health OT to maximize independence and safety with ADLs and mobility. Pt would benefit from continued skilled OT services in order to increase independence and safety with LB ADLs and functional mobility.   Follow Up Recommendations  SNF (If pt progress to going home, needs HHOT)    Equipment Recommendations  3 in 1 bedside comode    Recommendations for Other Services       Precautions / Restrictions Precautions Precautions: Knee Precaution Booklet Issued: No Precaution Comments: Reviewed no pillow under knee and precautions Restrictions Weight Bearing Restrictions: Yes RLE Weight Bearing: Weight bearing as tolerated      Mobility Bed Mobility Overal bed mobility: Needs Assistance Bed Mobility: Supine to Sit     Supine to sit: Supervision;HOB elevated     General bed mobility comments: Pt OOB in chair  Transfers Overall transfer level: Needs assistance Equipment used: Rolling walker (2 wheeled) Transfers: Sit to/from Stand Sit to Stand: Min guard         General transfer comment: Min guard for safety. Good hand placement and technique. Sit to stand from chair x 1, BSC x 1    Balance Overall balance assessment: Needs assistance Sitting-balance  support: Feet supported Sitting balance-Leahy Scale: Good     Standing balance support: During functional activity Standing balance-Leahy Scale: Fair Standing balance comment: use of RW                            ADL Overall ADL's : Needs assistance/impaired Eating/Feeding: Set up;Sitting   Grooming: Min guard;Standing       Lower Body Bathing: Min guard;Sit to/from stand       Lower Body Dressing: Min guard;Sit to/from stand Lower Body Dressing Details (indicate cue type and reason): Pt able to doff/don sock on R LE. Educated pt on compensatory strategies for LB ADLs; pt verbalized understanding. Toilet Transfer: Min guard;Ambulation;BSC;RW Toilet Transfer Details (indicate cue type and reason): Educated pt on use of 3 in 1 over toilet. Toileting- ArchitectClothing Manipulation and Hygiene: Min guard;Sit to/from stand       Functional mobility during ADLs: Min guard;Rolling walker General ADL Comments: No family present for OT eval. Pt concerned about returning home directly from the hospital secondary to no supervision or assistance at home. Educated pt on home safety, edema management; pt verbalized understanding.     Vision     Perception     Praxis      Pertinent Vitals/Pain Pain Assessment: Faces Faces Pain Scale: Hurts a little bit Pain Location: R knee Pain Descriptors / Indicators: Sore Pain Intervention(s): Limited activity within patient's tolerance;Monitored during session;Repositioned;Ice applied     Hand Dominance     Extremity/Trunk Assessment Upper Extremity Assessment  Upper Extremity Assessment: Overall WFL for tasks assessed   Lower Extremity Assessment Lower Extremity Assessment: Defer to PT evaluation   Cervical / Trunk Assessment Cervical / Trunk Assessment: Normal   Communication Communication Communication: No difficulties   Cognition Arousal/Alertness: Awake/alert Behavior During Therapy: WFL for tasks  assessed/performed Overall Cognitive Status: Within Functional Limits for tasks assessed                     General Comments       Exercises Exercises: Total Joint     Shoulder Instructions      Home Living Family/patient expects to be discharged to:: Unsure Living Arrangements: Alone Available Help at Discharge: Other (Comment) (none) Type of Home: House Home Access: Level entry     Home Layout: One level     Bathroom Shower/Tub: Producer, television/film/video: Handicapped height Bathroom Accessibility: Yes How Accessible: Accessible via walker Home Equipment: None          Prior Functioning/Environment Level of Independence: Independent        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[employer]    OT Diagnosis: Acute pain   OT Problem List: Impaired balance (sitting and/or standing);Decreased safety awareness;Decreased knowledge of use of DME or AE;Decreased knowledge of precautions;Pain   OT Treatment/Interventions: Self-care/ADL training;DME and/or AE instruction;Patient/family education    OT Goals(Current goals can be found in the care plan section) Acute Rehab OT Goals Patient Stated Goal: return to independence OT Goal Formulation: With patient Time For Goal Achievement: 10/11/15 Potential to Achieve Goals: Good ADL Goals Pt Will Perform Grooming: with modified independence;standing Pt Will Perform Lower Body Bathing: with modified independence;sit to/from stand Pt Will Perform Lower Body Dressing: with modified independence;sit to/from stand Pt Will Transfer to Toilet: with modified independence;ambulating;bedside commode (over toilet) Pt Will Perform Toileting - Clothing Manipulation and hygiene: with modified independence;sit to/from stand Pt Will Perform Tub/Shower Transfer: Shower transfer;with modified independence;ambulating;3 in 1;rolling walker  OT Frequency: Min 2X/week   Barriers to D/C: Decreased caregiver support  Pt reports he has no family in  the area and no one is available to provide supervision or assistance upon return home.       Co-evaluation              End of Session  Activity Tolerance: Patient tolerated treatment well Patient left: in chair;with call bell/phone within reach   Time: 4098-1191 OT Time Calculation (min): 20 min Charges:  OT General Charges $OT Visit: 1 Procedure OT Evaluation $Initial OT Evaluation Tier I: 1 Procedure G-Codes:     Gaye Alken M.S., OTR/L Pager: [phone]  09/27/2015, 11:34 AM

## 2015-09-27 NOTE — Progress Notes (Signed)
Orthopedic Tech Progress Note Patient Details:  Scott Johnson 04/06/1946 [id number]  Patient ID: Scott MurphyFranklin Zachery, male   DOB: 04/14/1946, 69 y.o.   MRN: [id number] Placed pt's rle on cpm @ 0-50 degrees @1420   [name], Callaway Hailes 09/27/2015, 2:18 PM

## 2015-09-27 NOTE — Progress Notes (Signed)
Physical Therapy Treatment Patient Details Name: Scott Johnson MRN: [id number] DOB: 18-Mar-1946 Today's Date: 09/27/2015    History of Present Illness Patient is a 68 y/o male s/p Rt TKA. PMH includes HTN, HLD, arthritis.    PT Comments    Patient progressing toward goals with ability to ambulate 100 ft and overall mobility level of min guard/supervision. Educated pt of HEP and use of bone foam. Continue to progress as tolerated.   Follow Up Recommendations  SNF    Equipment Recommendations  Other (defer to next venue)   Recommendations for Other Services       Precautions / Restrictions Precautions Precautions: Knee Precaution Booklet Issued: No Precaution Comments: Reviewed no pillow under knee and precautions Restrictions Weight Bearing Restrictions: Yes RLE Weight Bearing: Weight bearing as tolerated    Mobility  Bed Mobility Overal bed mobility: Needs Assistance Bed Mobility: Supine to Sit     Supine to sit: Supervision;HOB elevated     General bed mobility comments: Pt OOB in chair  Transfers Overall transfer level: Needs assistance Equipment used: Rolling walker (2 wheeled) Transfers: Sit to/from Stand Sit to Stand: Min guard         General transfer comment: Min guard for safety. Good hand placement and technique. Sit to stand from chair x 1, BSC x 1  Ambulation/Gait Ambulation/Gait assistance: Min guard;Supervision (min guard for safety when turning) Ambulation Distance (Feet): 100 Feet Assistive device: Rolling walker (2 wheeled) Gait Pattern/deviations: Decreased stance time - right;Antalgic;Trunk flexed;Decreased step length - left;Step-through pattern Gait velocity: decreased   General Gait Details: cues for distributing weight to L, increasing L knee flexion during initial and midswing phase, and for deviations listed above; able to correct for short distances after cues   Stairs            Wheelchair Mobility    Modified  Rankin (Stroke Patients Only)       Balance Overall balance assessment: Needs assistance Sitting-balance support: Feet supported Sitting balance-Leahy Scale: Good     Standing balance support: During functional activity Standing balance-Leahy Scale: Fair Standing balance comment: use of RW                    Cognition Arousal/Alertness: Awake/alert Behavior During Therapy: WFL for tasks assessed/performed Overall Cognitive Status: Within Functional Limits for tasks assessed                      Exercises Total Joint Exercises Quad Sets: AROM;Right;10 reps;Supine Short Arc Quad: AROM;Right;10 reps;Supine Heel Slides: AROM;Right;10 reps;Supine Hip ABduction/ADduction: AROM;Right;10 reps;Supine Straight Leg Raises: AROM;Right;10 reps;Supine Goniometric ROM: 0-74    General Comments        Pertinent Vitals/Pain Pain Assessment: Faces Faces Pain Scale: Hurts a little bit Pain Location: R knee Pain Descriptors / Indicators: Sore Pain Intervention(s): Limited activity within patient's tolerance;Monitored during session;Repositioned;Ice applied    Home Living Family/patient expects to be discharged to:: Unsure Living Arrangements: Alone Available Help at Discharge: Other (Comment) (none) Type of Home: House Home Access: Level entry   Home Layout: One level Home Equipment: None      Prior Function Level of Independence: Independent      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[employer]   PT Goals (current goals can now be found in the care plan section) Acute Rehab PT Goals Patient Stated Goal: return to independence PT Goal Formulation: With patient Time For Goal Achievement: 10/10/15 Potential to Achieve Goals: Good Progress towards PT goals: Progressing toward goals  Frequency  7X/week    PT Plan Discharge plan needs to be updated    Co-evaluation             End of Session Equipment Utilized During Treatment: Gait belt Activity Tolerance: Patient  tolerated treatment well Patient left: in chair;with call bell/phone within reach     Time: 1021-1047 PT Time Calculation (min) (ACUTE ONLY): 26 min  Charges:  $Gait Training: 8-22 mins $Therapeutic Exercise: 8-22 mins                    G Codes:      Derek MoundKellyn R Odell Choung Conor Lata, PTA Pager: [phone]  09/27/2015, 11:42 AM

## 2015-09-27 NOTE — Progress Notes (Signed)
ANTICOAGULATION CONSULT NOTE  Pharmacy Consult for Coumadin Indication: VTE prophylaxis  Allergies  Allergen Reactions  . Penicillins Rash    On body   Labs:  Recent Labs  09/27/15 0516  HGB 13.3  HCT 38.9*  PLT 180  LABPROT 16.4*  INR 1.31  CREATININE 1.23    CrCl cannot be calculated (Unknown ideal weight.).   Medical History: Past Medical History  Diagnosis Date  . Hypertension   . Arthritis   . Benign prostate hyperplasia   . Hyperlipidemia     Assessment: 69 year old male s/p TKA to begin Coumadin for VTE prophylaxis INR = 1.31  Goal of Therapy:  INR 2-3 Monitor platelets by anticoagulation protocol: Yes   Plan:  Repeat Coumadin 10 mg po x 1 Daily INR  Thank you. Okey RegalLisa Maleigh Bagot, PharmD [phone]  09/27/2015,8:34 AM

## 2015-09-27 NOTE — Anesthesia Postprocedure Evaluation (Signed)
Anesthesia Post Note  Patient: Scott Johnson  Procedure(s) Performed: Procedure(s) (LRB): TOTAL KNEE ARTHROPLASTY, CEMENTED (Right)  Patient location during evaluation: PACU Anesthesia Type: General Level of consciousness: awake and alert and patient cooperative Pain management: pain level controlled Vital Signs Assessment: post-procedure vital signs reviewed and stable Respiratory status: spontaneous breathing and respiratory function stable Cardiovascular status: stable Anesthetic complications: no    Last Vitals:  Filed Vitals:   09/27/15 0220 09/27/15 0817  BP: 138/56 133/63  Pulse: 70 66  Temp: 36.9 C   Resp: 18     Last Pain:  Filed Vitals:   09/27/15 1016  PainSc: 8                  [name]

## 2015-09-28 ENCOUNTER — Encounter (HOSPITAL_COMMUNITY): Payer: Self-pay | Admitting: Orthopedic Surgery

## 2015-09-28 LAB — CBC
HCT: 36.8 % — ABNORMAL LOW (ref 39.0–52.0)
Hemoglobin: 12.4 g/dL — ABNORMAL LOW (ref 13.0–17.0)
MCH: 30.4 pg (ref 26.0–34.0)
MCHC: 33.7 g/dL (ref 30.0–36.0)
MCV: 90.2 fL (ref 78.0–100.0)
Platelets: 156 10*3/uL (ref 150–400)
RBC: 4.08 MIL/uL — ABNORMAL LOW (ref 4.22–5.81)
RDW: 14 % (ref 11.5–15.5)
WBC: 12.9 10*3/uL — ABNORMAL HIGH (ref 4.0–10.5)

## 2015-09-28 LAB — PROTIME-INR
INR: 1.53 — AB (ref 0.00–1.49)
PROTHROMBIN TIME: 18.5 s — AB (ref 11.6–15.2)

## 2015-09-28 MED ORDER — WARFARIN SODIUM 7.5 MG PO TABS
7.5000 mg | ORAL_TABLET | Freq: Once | ORAL | Status: AC
  Administered 2015-09-28: 7.5 mg via ORAL
  Filled 2015-09-28: qty 1

## 2015-09-28 NOTE — Clinical Social Work Note (Signed)
CSW received [facility] that patient has pre-registered with their facility.  Covering CSW notified.

## 2015-09-28 NOTE — Progress Notes (Signed)
CSW informed of pt being pre-registered at [facility]- PT recommending home health this morning and RNCM confirmed plan is for pt to [facility].  CSW signing off  Merlyn LotJenna Holoman, Connecticut[place]CSWA Clinical Social Worker [phone]

## 2015-09-28 NOTE — Progress Notes (Signed)
ANTICOAGULATION CONSULT NOTE  Pharmacy Consult for Coumadin Indication: VTE prophylaxis  Allergies  Allergen Reactions  . Penicillins Rash    On body   Labs:  Recent Labs  09/27/15 0516 09/28/15 0347  HGB 13.3 12.4*  HCT 38.9* 36.8*  PLT 180 156  LABPROT 16.4* 18.5*  INR 1.31 1.53*  CREATININE 1.23  --     CrCl cannot be calculated (Unknown ideal weight.).   Medical History: Past Medical History  Diagnosis Date  . Hypertension   . Arthritis   . Benign prostate hyperplasia   . Hyperlipidemia     Assessment: 69 year old male s/p TKA to begin Coumadin for VTE prophylaxis INR = 1.53  Goal of Therapy:  INR 2-3 Monitor platelets by anticoagulation protocol: Yes   Plan:  Coumadin 7.5 mg po x 1 Daily INR  Thank you. Okey RegalLisa Joshua Soulier, PharmD [phone]  09/28/2015,12:22 PM

## 2015-09-28 NOTE — Progress Notes (Signed)
Orthopedic Tech Progress Note Patient Details:  Scott Johnson 01/01/1946 [id number] On cpm at 1850 Patient ID: Scott MurphyFranklin Scott Johnson, male   DOB: 05/26/1946, 69 y.o.   MRN: [id number]   Jennye MoccasinHughes, [name] 09/28/2015, 6:52 PM

## 2015-09-28 NOTE — Care Management Note (Signed)
Case Management Note  Patient Details  Name: Scott Johnson MRN: [id number] Date of Birth: 11/25/1945  Subjective/Objective:    69 yr old gentleman s/p right total knee arthroplasty.  Action/Plan: Case manager spoke with patient concerning home health and DME needs. Patient states [facility], but therapy states that patient is doing quite well with mobility and transfers and will be able to discharge home. Patient is in agreement. DME has been delivered. Patient states he has a king sized bed and will have CPM placed on bed. He has a friend that will pick him up and do driving for appointments etc.    Expected Discharge Date:   09/29/15        Expected Discharge Plan:   Home with Home Health therapy  In-House Referral:  Clinical Social Work  Discharge planning Services  CM Consult  Post Acute Care Choice:  Home Health, Durable Medical Equipment Choice offered to:  Patient  DME Arranged:  3-N-1, CPM, Walker rolling DME Agency:  [employer]  HH Arranged:  PT HH [facility]  Status of Service:  Completed  Medicare Important Message Given:    Date Medicare IM Given:    Medicare IM give by:    Date Additional Medicare IM Given:    Additional Medicare Important Message give by:     If discussed at Long Length of Stay Meetings, dates discussed:    Additional Comments:  Durenda GuthrieBrady, Lashann Hagg Naomi, RN 09/28/2015, 10:17 AM

## 2015-09-28 NOTE — Progress Notes (Signed)
Occupational Therapy Treatment Patient Details Name: Jaydon Avina MRN: [id number] DOB: 10/16/1946 Today's Date: 09/28/2015    History of present illness Patient is a 69 y/o male s/p Rt TKA. PMH includes HTN, HLD, arthritis.   OT comments  Pt. Making great gains.  Currently S for all ADLS and b.room/shower stall transfers.  Stating he feels like he would be okay at home, and for his "mental wellness" would prefer home to SNF but "wants expert opinion" ie: PT/OT to determine for him.  Will discuss with PT and see how their session goes today.    Follow Up Recommendations  SNF;Home health OT    Equipment Recommendations  3 in 1 bedside comode    Recommendations for Other Services      Precautions / Restrictions Precautions Precautions: Knee Restrictions Weight Bearing Restrictions: Yes RLE Weight Bearing: Weight bearing as tolerated       Mobility Bed Mobility Overal bed mobility: Modified Independent Bed Mobility: Supine to Sit     Supine to sit: Modified independent (Device/Increase time)     General bed mobility comments: hob flat, exits from right side, no rails. no physical assist to sit up in bed or bring b les oob  Transfers Overall transfer level: Needs assistance Equipment used: Rolling walker (2 wheeled) Transfers: Sit to/from [employer] Sit to Stand: Supervision Stand pivot transfers: Supervision       General transfer comment: no safety cues required. pt. moving well    Balance                                   ADL Overall ADL's : Needs assistance/impaired                     Lower Body Dressing: Supervision/safety;Sitting/lateral leans Lower Body Dressing Details (indicate cue type and reason): able to cross legs over knees and reach feet for  Toilet Transfer: Supervision/safety;Ambulation;RW;Regular Social worker and Hygiene: Supervision/safety;Sitting/lateral lean   Tub/  Magazine features editor;Anterior/posterior;Supervision/safety;Ambulation Tub/Shower Transfer Details (indicate cue type and reason): able to return demo of tepping backwards into the shower Functional mobility during ADLs: Supervision/safety;Rolling walker General ADL Comments: pt. as S level of A with lb adls, toileting, and shower stall transfer.  he states he feels like he could go home but is also wanting the "experts opinion"      Vision                     Perception     Praxis      Cognition   Behavior During Therapy: WFL for tasks assessed/performed Overall Cognitive Status: Within Functional Limits for tasks assessed                       Extremity/Trunk Assessment               Exercises     Shoulder Instructions       General Comments      Pertinent Vitals/ Pain       Pain Assessment: 0-10 Pain Score: 8  Pain Location: R knee Pain Descriptors / Indicators: Sore Pain Intervention(s): Patient requesting pain meds-RN notified;RN gave pain meds during session;Repositioned  Home Living  Prior Functioning/Environment              Frequency Min 2X/week     Progress Toward Goals  OT Goals(current goals can now be found in the care plan section)  Progress towards OT goals: Progressing toward goals     Plan Discharge plan remains appropriate    Co-evaluation                 End of Session Equipment Utilized During Treatment: Gait belt;Rolling walker CPM Right Knee CPM Right Knee: Off   Activity Tolerance Patient tolerated treatment well   Patient Left in chair;with call bell/phone within reach;with nursing/sitter in room   Nurse Communication          Time: 8657-84690826-0847 OT Time Calculation (min): 21 min  Charges: OT General Charges $OT Visit: 1 Procedure OT Treatments $Self Care/Home Management : 8-22 mins  Robet LeuMorris, Kalinda Romaniello Lorraine,  COTA/L 09/28/2015, 8:54 AM

## 2015-09-28 NOTE — Progress Notes (Signed)
Physical Therapy Treatment Patient Details Name: Scott Johnson MRN: [id number] DOB: 02/26/1946 Today's Date: 09/28/2015    History of Present Illness Patient is a 69 y/o male s/p Rt TKA. PMH includes HTN, HLD, arthritis.    PT Comments    Patient continues to progress well toward mobility goals with ability to ambulate 250 ft. Overall mobility level of supervision/mod I and Rt knee ROM of 0-85-90. Patient led through HEP and tolerated exercises well. Pt lives alone and reports he has a friend that will "drive him where he needs to go" and that for his "mental wellness" he would prefer to go home vs SNF. Recommending d/c plan to home with home health PT for maximized independence and safety with mobility.   Follow Up Recommendations  Home health PT     Equipment Recommendations       Recommendations for Other Services       Precautions / Restrictions Precautions Precautions: Knee Precaution Booklet Issued: No Precaution Comments: Reviewed no pillow under knee and precautions Restrictions Weight Bearing Restrictions: Yes RLE Weight Bearing: Weight bearing as tolerated    Mobility  Bed Mobility Overal bed mobility: Needs Assistance Bed Mobility: Sit to Supine     Supine to sit: Modified independent (Device/Increase time)     General bed mobility comments: no physical assist needed; no use of bed rail; HOB flat   Transfers Overall transfer level: Needs assistance Equipment used: Rolling walker (2 wheeled) Transfers: Sit to/from Stand Sit to Stand: Supervision Stand pivot transfers: Supervision       General transfer comment: carry over of proper hand placement and sequencing   Ambulation/Gait Ambulation/Gait assistance: Supervision Ambulation Distance (Feet): 200 Feet Assistive device: Rolling walker (2 wheeled) Gait Pattern/deviations: Decreased stride length;Decreased step length - left Gait velocity: decreased   General Gait Details: cues for increased  Rt knee flexion during initial and midswing phase and facilitation of heel strike; ability to correct with cues and needed less cues after inital 75 ft   Stairs            Wheelchair Mobility    Modified Rankin (Stroke Patients Only)       Balance     Sitting balance-Leahy Scale: Good     Standing balance support: During functional activity Standing balance-Leahy Scale: Good                      Cognition Arousal/Alertness: Awake/alert Behavior During Therapy: WFL for tasks assessed/performed Overall Cognitive Status: Within Functional Limits for tasks assessed                      Exercises Total Joint Exercises Quad Sets: AROM;Right;10 reps;Supine Short Arc Quad: AROM;Right;10 reps;Supine Heel Slides: AROM;Right;10 reps;Supine Hip ABduction/ADduction: AROM;Right;10 reps;Supine Straight Leg Raises: AROM;Right;10 reps;Supine Long Arc Quad: AROM;Right;10 reps;Seated Goniometric ROM: 0-85    General Comments General comments (skin integrity, edema, etc.): Pt reports he has friend to take him to drive him where he needs to go      Pertinent Vitals/Pain Pain Assessment: Faces Pain Score: 8  Faces Pain Scale: Hurts a little bit Pain Location: R knee Pain Descriptors / Indicators: Sore Pain Intervention(s): Monitored during session;Premedicated before session    Home Living                      Prior Function            PT Goals (current goals can now be  found in the care plan section) Acute Rehab PT Goals Patient Stated Goal: get better by mid January PT Goal Formulation: With patient Time For Goal Achievement: 10/10/15 Potential to Achieve Goals: Good Progress towards PT goals: Progressing toward goals    Frequency  7X/week    PT Plan Current plan remains appropriate    Co-evaluation             End of Session Equipment Utilized During Treatment: Gait belt Activity Tolerance: Patient tolerated treatment  well Patient left: in chair;with call bell/phone within reach     Time: 0926-0956 PT Time Calculation (min) (ACUTE ONLY): 30 min  Charges:  $Gait Training: 8-22 mins $Therapeutic Exercise: 8-22 mins                    G Codes:      Derek Mound, PTA Pager: [phone]   09/28/2015, 10:44 AM

## 2015-09-28 NOTE — Progress Notes (Signed)
Subjective: Pt stable - pain ok   Objective: Vital signs in last 24 hours: Temp:  [98.5 F (36.9 C)-98.9 F (37.2 C)] 98.5 F (36.9 C) (12/08 1348) Pulse Rate:  [67-76] 68 (12/08 1348) Resp:  [18] 18 (12/08 1348) BP: (120-135)/(45-74) 124/50 mmHg (12/08 1348) SpO2:  [93 %-94 %] 93 % (12/08 1348)  Intake/Output from previous day: 12/07 0701 - 12/08 0700 In: 720 [P.O.:720] Out: -  Intake/Output this shift: Total I/O In: 480 [P.O.:480] Out: -   Exam:  Neurovascular intact Sensation intact distally Intact pulses distally  Labs:  Recent Labs  09/27/15 0516 09/28/15 0347  HGB 13.3 12.4*    Recent Labs  09/27/15 0516 09/28/15 0347  WBC 11.4* 12.9*  RBC 4.32 4.08*  HCT 38.9* 36.8*  PLT 180 156    Recent Labs  09/27/15 0516  NA 136  K 3.6  CL 101  CO2 26  BUN 13  CREATININE 1.23  GLUCOSE 131*  CALCIUM 8.4*    Recent Labs  09/27/15 0516 09/28/15 0347  INR 1.31 1.53*    Assessment/Plan: Plan dc to snf tomorrow - pt has no support at hpme - [facility]   DEAN,GREGORY SCOTT 09/28/2015, 6:29 PM

## 2015-09-29 LAB — CBC
HCT: 37 % — ABNORMAL LOW (ref 39.0–52.0)
Hemoglobin: 12.7 g/dL — ABNORMAL LOW (ref 13.0–17.0)
MCH: 31 pg (ref 26.0–34.0)
MCHC: 34.3 g/dL (ref 30.0–36.0)
MCV: 90.2 fL (ref 78.0–100.0)
PLATELETS: 189 10*3/uL (ref 150–400)
RBC: 4.1 MIL/uL — AB (ref 4.22–5.81)
RDW: 14 % (ref 11.5–15.5)
WBC: 11.7 10*3/uL — AB (ref 4.0–10.5)

## 2015-09-29 LAB — PROTIME-INR
INR: 1.61 — ABNORMAL HIGH (ref 0.00–1.49)
PROTHROMBIN TIME: 19.2 s — AB (ref 11.6–15.2)

## 2015-09-29 MED ORDER — OXYCODONE HCL 5 MG PO TABS: 5.0000 mg | ORAL_TABLET | ORAL | Status: DC | PRN

## 2015-09-29 MED ORDER — METHOCARBAMOL 500 MG PO TABS: 500.0000 mg | ORAL_TABLET | Freq: Four times a day (QID) | ORAL | Status: DC

## 2015-09-29 MED ORDER — WARFARIN SODIUM 7.5 MG PO TABS
7.5000 mg | ORAL_TABLET | Freq: Once | ORAL | Status: AC
  Administered 2015-09-29: 7.5 mg via ORAL
  Filled 2015-09-29: qty 1

## 2015-09-29 MED ORDER — WARFARIN SODIUM 5 MG PO TABS: 5.0000 mg | ORAL_TABLET | Freq: Every day | ORAL | Status: DC

## 2015-09-29 NOTE — Progress Notes (Signed)
Subjective: Pt stable - making progress with PT   Objective: Vital signs in last 24 hours: Temp:  [98.5 F (36.9 C)-98.7 F (37.1 C)] 98.7 F (37.1 C) (12/09 0626) Pulse Rate:  [68-71] 71 (12/09 0626) Resp:  [18] 18 (12/09 0626) BP: (124-139)/(50-70) 139/70 mmHg (12/09 0626) SpO2:  [93 %-96 %] 96 % (12/09 0626)  Intake/Output from previous day: 12/08 0701 - 12/09 0700 In: 480 [P.O.:480] Out: -  Intake/Output this shift: Total I/O In: 240 [P.O.:240] Out: -   Exam:  Neurovascular intact Sensation intact distally Intact pulses distally  Labs:  Recent Labs  09/27/15 0516 09/28/15 0347 09/29/15 0428  HGB 13.3 12.4* 12.7*    Recent Labs  09/28/15 0347 09/29/15 0428  WBC 12.9* 11.7*  RBC 4.08* 4.10*  HCT 36.8* 37.0*  PLT 156 189    Recent Labs  09/27/15 0516  NA 136  K 3.6  CL 101  CO2 26  BUN 13  CREATININE 1.23  GLUCOSE 131*  CALCIUM 8.4*    Recent Labs  09/28/15 0347 09/29/15 0428  INR 1.53* 1.61*    Assessment/Plan: Plan dc am - rx on chart   Johnson Johnson 09/29/2015, 1:07 PM

## 2015-09-29 NOTE — Progress Notes (Addendum)
Physical Therapy Treatment Patient Details Name: Scott Johnson MRN: [id number] DOB: Sep 19, 1946 Today's Date: 09/29/2015    History of Present Illness Patient is a 69 y/o male s/p Rt TKA. PMH includes HTN, HLD, arthritis.    PT Comments    Patient continues to do well with PT. Pt with improved gait pattern and safety with ambulation. Overall mobility level of supervision. Continue to progress as tolerated. Completion of HEP next session.   Follow Up Recommendations  Home health PT     Equipment Recommendations       Recommendations for Other Services       Precautions / Restrictions Precautions Precautions: Knee Precaution Booklet Issued: No Precaution Comments: Reviewed no pillow under knee and precautions Restrictions Weight Bearing Restrictions: Yes RLE Weight Bearing: Weight bearing as tolerated    Mobility  Bed Mobility                  Transfers Overall transfer level: Needs assistance Equipment used: Rolling walker (2 wheeled) Transfers: Sit to/from Stand Sit to Stand: Supervision         General transfer comment: good hand placement and technique with good safety awareness  Ambulation/Gait Ambulation/Gait assistance: Supervision Ambulation Distance (Feet): 200 Feet Assistive device: Rolling walker (2 wheeled) Gait Pattern/deviations: Step-through pattern Gait velocity: decreased   General Gait Details: pt with improved bilat heel strike; min cues for increased Rt knee flexion with swing phase and carry over of relaxing shoulders and maintaining upright posture   Stairs    Wheelchair Mobility    Modified Rankin (Stroke Patients Only)       Balance     Sitting balance-Leahy Scale: Good     Standing balance support: During functional activity Standing balance-Leahy Scale: Good                      Cognition Arousal/Alertness: Awake/alert Behavior During Therapy: WFL for tasks assessed/performed Overall Cognitive  Status: Within Functional Limits for tasks assessed                      Exercises      General Comments        Pertinent Vitals/Pain Pain Assessment: denies pain Faces Pain Scale:  Pain Location:  Pain Descriptors / Indicators:  Pain Intervention(s): Limited activity within patient's tolerance;Monitored during session;Ice applied;RN gave pain meds during session    Home Living                      Prior Function            PT Goals (current goals can now be found in the care plan section) Acute Rehab PT Goals Patient Stated Goal: get better by mid January PT Goal Formulation: With patient Time For Goal Achievement: 10/10/15 Potential to Achieve Goals: Good Progress towards PT goals: Progressing toward goals    Frequency  7X/week    PT Plan Current plan remains appropriate    Co-evaluation             End of Session Equipment Utilized During Treatment: Gait belt Activity Tolerance: Patient tolerated treatment well Patient left: in chair;with call bell/phone within reach     Time: 1307-1321 PT Time Calculation (min) (ACUTE ONLY): 14 min  Charges:  $Gait Training: 8-22 mins                    G Codes:      Carolynne Edouard  Erline LevineKellyn Kylle Lall, PTA Pager: [phone]) [phone]   09/29/2015, 1:51 PM

## 2015-09-29 NOTE — Progress Notes (Signed)
ANTICOAGULATION CONSULT NOTE  Pharmacy Consult for Coumadin Indication: VTE prophylaxis  Allergies  Allergen Reactions  . Penicillins Rash    On body   Labs:  Recent Labs  09/27/15 0516 09/28/15 0347 09/29/15 0428  HGB 13.3 12.4* 12.7*  HCT 38.9* 36.8* 37.0*  PLT 180 156 189  LABPROT 16.4* 18.5* 19.2*  INR 1.31 1.53* 1.61*  CREATININE 1.23  --   --     CrCl cannot be calculated (Unknown ideal weight.).   Medical History: Past Medical History  Diagnosis Date  . Hypertension   . Arthritis   . Benign prostate hyperplasia   . Hyperlipidemia     Assessment: 69 year old male s/p TKA to begin Coumadin for VTE prophylaxis INR = 1.61  Goal of Therapy:  INR 2-3 Monitor platelets by anticoagulation protocol: Yes   Plan:  Coumadin 7.5 mg po x 1 Daily INR  If home, recommend Coumadin 7.5 mg po daily with follow up INR on Monday  Thank you. Okey RegalLisa Ruhee Enck, PharmD [phone]  09/29/2015,11:19 AM

## 2015-09-29 NOTE — Progress Notes (Addendum)
Physical Therapy Treatment Patient Details Name: Scott Johnson MRN: [id number] DOB: [birth year] Today's Date: 09/29/2015    History of Present Illness Patient is a 69 y/o male s/p Rt TKA. PMH includes HTN, HLD, arthritis.    PT Comments    Patient with ability to complete HEP with min cues and place LE in bone foam properly without assistance. Pt tolerated exercises well and encouraged to perform HEP multiple times a day. Overall mobility level of supervision/mod I. Continue to progress as tolerated.  Follow Up Recommendations  Home health PT     Equipment Recommendations       Recommendations for Other Services       Precautions / Restrictions Precautions Precautions: Knee Precaution Booklet Issued: No Precaution Comments: Reviewed no pillow under knee and precautions Restrictions Weight Bearing Restrictions: Yes RLE Weight Bearing: Weight bearing as tolerated    Mobility  Bed Mobility Overal bed mobility: Needs Assistance Bed Mobility: Sit to Supine       Sit to supine: Modified independent (Device/Increase time)   General bed mobility comments: no physical assist needed; no use of bed rails with HOB flat  Transfers Overall transfer level: Needs assistance Equipment used: Rolling walker (2 wheeled) Transfers: Sit to/from Stand Sit to Stand: Supervision         General transfer comment: carry over of hand placement and sequencing  Ambulation/Gait Ambulation/Gait assistance: Supervision Ambulation Distance (Feet): 20 Feet (in room) Assistive device: Rolling walker (2 wheeled) Gait Pattern/deviations: Step-through pattern Gait velocity: decreased   General Gait Details: pt with upright posture and safe use of AD with gait pattern   Stairs           Wheelchair Mobility    Modified Rankin (Stroke Patients Only)       Balance   Sitting-balance support: No upper extremity supported Sitting balance-Leahy Scale: Good     Standing balance  support: During functional activity Standing balance-Leahy Scale: Good                      Cognition Arousal/Alertness: Awake/alert Behavior During Therapy: WFL for tasks assessed/performed Overall Cognitive Status: Within Functional Limits for tasks assessed                      Exercises Total Joint Exercises Ankle Circles/Pumps: AROM;Both;10 reps Quad Sets: AROM;Right;10 reps Heel Slides: AROM;Right;10 reps Hip ABduction/ADduction: AROM;Right;10 reps Straight Leg Raises: AROM;Right;10 reps Long Arc Quad: AROM;Right;10 reps Knee Flexion: AROM;Right;10 reps (stretch in sitting using L LE for assist ) Goniometric ROM: 0-90    General Comments        Pertinent Vitals/Pain Pain Assessment: Faces Faces Pain Scale: Hurts a little bit (with activity) Pain Location: R knee Pain Descriptors / Indicators: Sore Pain Intervention(s): Limited activity within patient's tolerance;Monitored during session    Home Living                      Prior Function            PT Goals (current goals can now be found in the care plan section) Acute Rehab PT Goals Patient Stated Goal: get better by mid January PT Goal Formulation: With patient Time For Goal Achievement: 10/10/15 Potential to Achieve Goals: Good Progress towards PT goals: Progressing toward goals    Frequency  7X/week    PT Plan Current plan remains appropriate    Co-evaluation  End of Session Equipment Utilized During Treatment: Gait belt Activity Tolerance: Patient tolerated treatment well Patient left: with call bell/phone within reach;in bed     Time: [id number] PT Time Calculation (min) (ACUTE ONLY): 17 min  Charges:   $Therapeutic Exercise: 8-22 mins                    G Codes:      Derek MoundKellyn R Kolby Schara Jaylee Lantry, PTA Pager: [phone]   09/29/2015, 3:27 PM

## 2015-09-30 LAB — PROTIME-INR
INR: 1.64 — AB (ref 0.00–1.49)
PROTHROMBIN TIME: 19.5 s — AB (ref 11.6–15.2)

## 2015-09-30 NOTE — Discharge Summary (Signed)
Physician Discharge Summary  Patient ID: Scott Johnson MRN: [id number] DOB/AGE: 69/02/1946 69 y.o.  Admit date: 09/26/2015 Discharge date: 09/30/2015  Admission Diagnoses: Arthritis right knee  Discharge Diagnoses:  Active Problems:   Degenerative arthritis of right knee   Discharged Condition: stable  Hospital Course: Patient's hospital course was essentially unremarkable. Patient progressed slowly with therapy and was discharged to skilled nursing in stable condition.  Consults: None  Significant Diagnostic Studies: labs: Routine labs  Treatments: surgery: See operative note  Discharge Exam: Blood pressure 124/66, pulse 61, temperature 98.8 F (37.1 C), temperature source Oral, resp. rate 18, SpO2 94 %. Incision/Wound: dressing clean and dry  Disposition: Final discharge disposition not confirmed  Discharge Instructions    Call MD / Call 911    Complete by:  As directed   If you experience chest pain or shortness of breath, CALL 911 and be transported to the hospital emergency room.  If you develope a fever above 101 F, pus (white drainage) or increased drainage or redness at the wound, or calf pain, call your surgeon's office.     Call MD / Call 911    Complete by:  As directed   If you experience chest pain or shortness of breath, CALL 911 and be transported to the hospital emergency room.  If you develope a fever above 101 F, pus (white drainage) or increased drainage or redness at the wound, or calf pain, call your surgeon's office.     Constipation Prevention    Complete by:  As directed   Drink plenty of fluids.  Prune juice may be helpful.  You may use a stool softener, such as Colace (over the counter) 100 mg twice a day.  Use MiraLax (over the counter) for constipation as needed.     Constipation Prevention    Complete by:  As directed   Drink plenty of fluids.  Prune juice may be helpful.  You may use a stool softener, such as Colace (over the counter) 100 mg  twice a day.  Use MiraLax (over the counter) for constipation as needed.     Diet - low sodium heart healthy    Complete by:  As directed      Diet - low sodium heart healthy    Complete by:  As directed      Discharge instructions    Complete by:  As directed   Weight bearing as tolerated Use cpm machine 6 hours per day Use foam boot when recumbent  INSTRUCTIONS AFTER JOINT REPLACEMENT   Remove items at home which could result in a fall. This includes throw rugs or furniture in walking pathways ICE to the affected joint every three hours while awake for 30 minutes at a time, for at least the first 3-5 days, and then as needed for pain and swelling.  Continue to use ice for pain and swelling. You may notice swelling that will progress down to the foot and ankle.  This is normal after surgery.  Elevate your leg when you are not up walking on it.   Continue to use the breathing machine you got in the hospital (incentive spirometer) which will help keep your temperature down.  It is common for your temperature to cycle up and down following surgery, especially at night when you are not up moving around and exerting yourself.  The breathing machine keeps your lungs expanded and your temperature down.   DIET:  As you were doing prior to hospitalization, we recommend  a well-balanced diet.  DRESSING / WOUND CARE / SHOWERING  You may shower 3 days after surgery, but keep the wounds dry during showering.  You may use an occlusive plastic wrap (Press'n Seal for example), NO SOAKING/SUBMERGING IN THE BATHTUB.  If the bandage gets wet, change with a clean dry gauze.  If the incision gets wet, pat the wound dry with a clean towel.  ACTIVITY  Increase activity slowly as tolerated, but follow the weight bearing instructions below.   No driving for 6 weeks or until further direction given by your physician.  You cannot drive while taking narcotics.  No lifting or carrying greater than 10 lbs. until  further directed by your surgeon. Avoid periods of inactivity such as sitting longer than an hour when not asleep. This helps prevent blood clots.  You may return to work once you are authorized by your doctor.     WEIGHT BEARING   Weight bearing as tolerated with assist device (walker, cane, etc) as directed, use it as long as suggested by your surgeon or therapist, typically at least 4-6 weeks.   EXERCISES  Results after joint replacement surgery are often greatly improved when you follow the exercise, range of motion and muscle strengthening exercises prescribed by your doctor. Safety measures are also important to protect the joint from further injury. Any time any of these exercises cause you to have increased pain or swelling, decrease what you are doing until you are comfortable again and then slowly increase them. If you have problems or questions, call your caregiver or physical therapist for advice.   Rehabilitation is important following a joint replacement. After just a few days of immobilization, the muscles of the leg can become weakened and shrink (atrophy).  These exercises are designed to build up the tone and strength of the thigh and leg muscles and to improve motion. Often times heat used for twenty to thirty minutes before working out will loosen up your tissues and help with improving the range of motion but do not use heat for the first two weeks following surgery (sometimes heat can increase post-operative swelling).   These exercises can be done on a training (exercise) mat, on the floor, on a table or on a bed. Use whatever works the best and is most comfortable for you.    Use music or television while you are exercising so that the exercises are a pleasant break in your day. This will make your life better with the exercises acting as a break in your routine that you can look forward to.   Perform all exercises about fifteen times, three times per day or as directed.  You  should exercise both the operative leg and the other leg as well.   Exercises include:   Quad Sets - Tighten up the muscle on the front of the thigh (Quad) and hold for 5-10 seconds.   Straight Leg Raises - With your knee straight (if you were given a brace, keep it on), lift the leg to 60 degrees, hold for 3 seconds, and slowly lower the leg.  Perform this exercise against resistance later as your leg gets stronger.  Leg Slides: Lying on your back, slowly slide your foot toward your buttocks, bending your knee up off the floor (only go as far as is comfortable). Then slowly slide your foot back down until your leg is flat on the floor again.  Angel Wings: Lying on your back spread your legs to the side  as far apart as you can without causing discomfort.  Hamstring Strength:  Lying on your back, push your heel against the floor with your leg straight by tightening up the muscles of your buttocks.  Repeat, but this time bend your knee to a comfortable angle, and push your heel against the floor.  You may put a pillow under the heel to make it more comfortable if necessary.   A rehabilitation program following joint replacement surgery can speed recovery and prevent re-injury in the future due to weakened muscles. Contact your doctor or a physical therapist for more information on knee rehabilitation.    CONSTIPATION  Constipation is defined medically as fewer than three stools per week and severe constipation as less than one stool per week.  Even if you have a regular bowel pattern at home, your normal regimen is likely to be disrupted due to multiple reasons following surgery.  Combination of anesthesia, postoperative narcotics, change in appetite and fluid intake all can affect your bowels.   YOU MUST use at least one of the following options; they are listed in order of increasing strength to get the job done.  They are all available over the counter, and you may need to use some, POSSIBLY even  all of these options:    Drink plenty of fluids (prune juice may be helpful) and high fiber foods Colace 100 mg by mouth twice a day  Senokot for constipation as directed and as needed Dulcolax (bisacodyl), take with full glass of water  Miralax (polyethylene glycol) once or twice a day as needed.  If you have tried all these things and are unable to have a bowel movement in the first 3-4 days after surgery call either your surgeon or your primary doctor.    If you experience loose stools or diarrhea, hold the medications until you stool forms back up.  If your symptoms do not get better within 1 week or if they get worse, check with your doctor.  If you experience "the worst abdominal pain ever" or develop nausea or vomiting, please contact the office immediately for further recommendations for treatment.   ITCHING:  If you experience itching with your medications, try taking only a single pain pill, or even half a pain pill at a time.  You can also use Benadryl over the counter for itching or also to help with sleep.   TED HOSE STOCKINGS:  Use stockings on both legs until for at least 2 weeks or as directed by physician office. They may be removed at night for sleeping.  MEDICATIONS:  See your medication summary on the "After Visit Summary" that nursing will review with you.  You may have some home medications which will be placed on hold until you complete the course of blood thinner medication.  It is important for you to complete the blood thinner medication as prescribed.  PRECAUTIONS:  If you experience chest pain or shortness of breath - call 911 immediately for transfer to the hospital emergency department.   If you develop a fever greater that 101 F, purulent drainage from wound, increased redness or drainage from wound, foul odor from the wound/dressing, or calf pain - CONTACT YOUR SURGEON.  FOLLOW-UP APPOINTMENTS:  If you do not  already have a post-op appointment, please call the office for an appointment to be seen by your surgeon.  Guidelines for how soon to be seen are listed in your "After Visit Summary", but are typically between 1-4 weeks after surgery.  OTHER INSTRUCTIONS:   Knee Replacement:  Do not place pillow under knee, focus on keeping the knee straight while resting. CPM instructions: 0-90 degrees, 2 hours in the morning, 2 hours in the afternoon, and 2 hours in the evening. Place foam block, curve side up under heel at all times except when in CPM or when walking.  DO NOT modify, tear, cut, or change the foam block in any way.  MAKE SURE YOU:  Understand these instructions.  Get help right away if you are not doing well or get worse.    Thank you for letting us be a part of your medical care team.  It is a privilege we respect greatly.  We hope these instructions will help you stay on track for a fast and full recovery!     Increase activity slowly as tolerated    Complete by:  As directed      Increase activity slowly as tolerated    Complete by:  As directed             Medication List    TAKE these medications        finasteride 5 MG tablet  Commonly known as:  PROSCAR  Take 5 mg by mouth daily.     losartan-hydrochlorothiazide 100-25 MG tablet  Commonly known as:  HYZAAR  Take 1 tablet by mouth daily.     methocarbamol 500 MG tablet  Commonly known as:  ROBAXIN  Take 1 tablet (500 mg total) by mouth 4 (four) times daily.     oxyCODONE 5 MG immediate release tablet  Commonly known as:  Oxy IR/ROXICODONE  Take 1-2 tablets (5-10 mg total) by mouth every 3 (three) hours as needed for breakthrough pain.     simvastatin 20 MG tablet  Commonly known as:  ZOCOR  Take 20 mg by mouth every evening.     warfarin 5 MG tablet  Commonly known as:  COUMADIN  Take 1 tablet (5 mg total) by mouth daily.           Follow-up Information    Follow up with [facility].   Why:   Someone from [facility] will contact you to arrange start date and time for therapy.l   Contact information:   [address] [place] [address] [phone]       Follow up with Cammy Copa, MD In 1 week.   Specialty:  Orthopedic Surgery   Contact information:   [address] [place] [address] [phone]       Signed: Nadara Mustard 09/30/2015, 7:33 AM

## 2015-09-30 NOTE — Progress Notes (Signed)
Patient discharged home. Discharge information and prescriptions given. [facility]

## 2015-09-30 NOTE — Progress Notes (Signed)
Pt stable Ready for dc home

## 2015-09-30 NOTE — Progress Notes (Signed)
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 PMH includes HTN, HLD, arthritis.    PT Comments    Patient progressing well towards PT goals. Improved ambulation distance today. Reviewed exercises and handout. Discussed car transfer technique. All education completed and questions answered. Pt has met all goals and safe to discharge from a mobility standpoint. Will follow up if still in hospital tomorrow.  Follow Up Recommendations  Home health PT;Supervision - Intermittent     Equipment Recommendations  None recommended by PT    Recommendations for Other Services       Precautions / Restrictions Precautions Precautions: Knee Precaution Booklet Issued: No Precaution Comments: Reviewed no pillow under knee and precautions Restrictions Weight Bearing Restrictions: Yes RLE Weight Bearing: Weight bearing as tolerated    Mobility  Bed Mobility Overal bed mobility: Needs Assistance Bed Mobility: Supine to Sit     Supine to sit: Modified independent (Device/Increase time)        Transfers Overall transfer level: Needs assistance Equipment used: Rolling walker (2 wheeled) Transfers: Sit to/from Stand Sit to Stand: Supervision         General transfer comment: SUpervision for safety. Stood from [employer], from toilet x1. Transferred to chair post ambulation bout.  Ambulation/Gait Ambulation/Gait assistance: Supervision Ambulation Distance (Feet): 250 Feet Assistive device: Rolling walker (2 wheeled) Gait Pattern/deviations: Step-through pattern;Decreased stride length Gait velocity: decreased   General Gait Details: Cues for normalized step through gait pattern and knee extension during stance phase of gait.   Stairs            Wheelchair Mobility    Modified Rankin (Stroke Patients Only)       Balance  Overall balance assessment: Needs assistance Sitting-balance support: Feet supported;No upper extremity supported Sitting balance-Leahy Scale: Good     Standing balance support: During functional activity                        Cognition Arousal/Alertness: Awake/alert Behavior During Therapy: WFL for tasks assessed/performed Overall Cognitive Status: Within Functional Limits for tasks assessed                      Exercises Total Joint Exercises Ankle Circles/Pumps: Both;10 reps;Supine Quad Sets: Both;10 reps;Supine Heel Slides: Right;10 reps;Seated Goniometric ROM: 0-100 degrees knee AROM    General Comments        Pertinent Vitals/Pain Pain Assessment: 0-10 Pain Score: 5  Pain Location: right knee Pain Descriptors / Indicators: Sore Pain Intervention(s): Monitored during session;Repositioned    Home Living                      Prior Function            PT Goals (current goals can now be found in the care plan section) Progress towards PT goals: Progressing toward goals    Frequency  7X/week    PT Plan Current plan remains appropriate    Co-evaluation             End of Session Equipment Utilized During Treatment: Gait belt Activity Tolerance: Patient tolerated treatment well Patient left: in chair;with call bell/phone within reach     Time: 0757-0813 PT Time Calculation (min) (ACUTE ONLY): 16 min  Charges:  $Gait Training: 8-22 mins  G Codes:      Lacie Draft 09/30/2015, 8:39 AM  Wray Kearns, PT, DPT [phone]

## 2016-05-15 ENCOUNTER — Encounter: Payer: Self-pay | Admitting: Cardiology

## 2016-05-15 ENCOUNTER — Encounter: Payer: Self-pay | Admitting: Cardiovascular Disease

## 2016-05-15 ENCOUNTER — Ambulatory Visit (INDEPENDENT_AMBULATORY_CARE_PROVIDER_SITE_OTHER): Payer: BC Managed Care – PPO | Admitting: Cardiovascular Disease

## 2016-05-15 VITALS — BP 120/75 | HR 60 | Ht 72.0 in | Wt 292.4 lb

## 2016-05-15 DIAGNOSIS — E669 Obesity, unspecified: Secondary | ICD-10-CM | POA: Insufficient documentation

## 2016-05-15 DIAGNOSIS — R0683 Snoring: Secondary | ICD-10-CM

## 2016-05-15 DIAGNOSIS — I209 Angina pectoris, unspecified: Secondary | ICD-10-CM

## 2016-05-15 DIAGNOSIS — I208 Other forms of angina pectoris: Secondary | ICD-10-CM

## 2016-05-15 DIAGNOSIS — I10 Essential (primary) hypertension: Secondary | ICD-10-CM | POA: Diagnosis not present

## 2016-05-15 DIAGNOSIS — E785 Hyperlipidemia, unspecified: Secondary | ICD-10-CM

## 2016-05-15 HISTORY — DX: Essential (primary) hypertension: I10

## 2016-05-15 NOTE — Patient Instructions (Addendum)
Lab work  Your physician recommends that you return for lab work.     Procedures   Your physician has requested that you have a cardiac catheterization. Cardiac catheterization is used to diagnose and/or treat various heart conditions. Doctors may recommend this procedure for a number of different reasons. The most common reason is to evaluate chest pain. Chest pain can be a symptom of coronary artery disease (CAD), and cardiac catheterization can show whether plaque is narrowing or blocking your heart's arteries. This procedure is also used to evaluate the valves, as well as measure the blood flow and oxygen levels in different parts of your heart. For further information please visit [email or web address] Please follow instruction sheet, as given.  Your physician has recommended that you have a sleep study. This test records several body functions during sleep, including: brain activity, eye movement, oxygen and carbon dioxide blood levels, heart rate and rhythm, breathing rate and rhythm, the flow of air through your mouth and nose, snoring, body muscle movements, and chest and belly movement.     Follow-up in 1 month with Dr. Duke Salvia.

## 2016-05-15 NOTE — Progress Notes (Signed)
Cardiology Office Note   Date:  05/15/2016   ID:  Scott Johnson, DOB [birth year], MRN [id number]  PCP:   Scott Lope, MD  Cardiologist:   Chilton Si, MD   Chief Complaint  Patient presents with  . New Patient (Initial Visit)    chest tightness, Pt states no other Sx.      History of Present Illness: Scott Johnson is a 70 y.o. male with obesity, hypertension and hyperlipidemia who presents for an evaluation of chest pain.  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  Since then he continues to note 2-3-/10 Substernal chest tightness with exertion. It typically occurs after walking for some distance or when walking up an incline.  He also noted it when walking around in [place].  There is no associated shortness of breath, nausea, vomiting or diaphoresis.  It typically improves after resting for 5-7 minutes.  [name] saw Dr. Duane Johnson on 05/13/16.  At that appointment he reported the chest tightness and he was started on a nitroglycerin patch.  He tried one on Sunday but developed a headache and stopped it.  On Monday he a more severe episode of chest tightness that was 5-6/10 and he thought this may have been related to the nitroglycerin. Therefore he has not taken any patches.  [name] does not report any lower extremity edema, orthopnea or PND.  He does not get any regular exercise.  At first he did not exercise due to knee pain. 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 Since then he reports that he is not exercising due to laziness.  He reports that when he was in the hospital for his knee replacement he was told that he may have sleep apnea. He reports feeling refreshed when he wakes up in the morning but does fall asleep easily throughout the day. He does report that he snores loudly.    Past Medical History:  Diagnosis Date  . Arthritis   . Benign prostate hyperplasia   . Chest pain   . Colon  polyp   . Essential hypertension 05/15/2016  . Hyperlipidemia   . Hypertension   . Ischemic chest pain (HCC) 05/15/2016  . Obesity   . Obesity (BMI 30-39.9) 05/15/2016  . SOB (shortness of breath)     Past Surgical History:  Procedure Laterality Date  . EYE SURGERY Bilateral 2015   Cataract removal  . 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 aspirin 81 MG tablet Take 81 mg by mouth daily.    . finasteride (PROSCAR) 5 MG tablet Take 5 mg by mouth daily.  3  . losartan-hydrochlorothiazide (HYZAAR) 100-25 MG tablet Take 1 tablet by mouth daily.  1  . nitroGLYCERIN (NITRODUR - DOSED IN MG/24 HR) 0.2 mg/hr patch Place 0.2 mg onto the skin daily.    . nitroGLYCERIN (NITROSTAT) 0.4 MG SL tablet Place 0.4 mg under the tongue every 5 (five) minutes as needed for chest pain.    . simvastatin (ZOCOR) 20 MG tablet Take 20 mg by mouth every evening.  3   No current facility-administered medications for this visit.     Allergies:   Penicillins    Social  History:  The patient  reports that he has never smoked. He has never used smokeless tobacco. He reports that he does not drink alcohol or use drugs.   Family History:  The patient's family history includes COPD in his sister; Cancer in his sister; Heart attack in his paternal grandmother; Heart disease in his maternal grandmother; Heart failure in his father, maternal grandfather, and mother; Hypertension in his mother.    ROS:  Please see the history of present illness.   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   All other systems are reviewed and negative.    PHYSICAL EXAM: VS:  BP 120/75   Pulse 60   Ht 6' (1.829 m)   Wt 292 lb 6.4 oz (132.6 kg)   BMI 39.66  kg/m  , BMI Body mass index is 39.66 kg/m. GENERAL:  Well appearing.  Obese HEENT:  Pupils equal round and reactive, fundi not visualized, oral mucosa unremarkable NECK:  No jugular venous distention, waveform within normal limits, carotid upstroke brisk and symmetric, no bruits, no thyromegaly LYMPHATICS:  No cervical adenopathy LUNGS:  Clear to auscultation bilaterally HEART:  RRR.  PMI not displaced or sustained,S1 and S2 within normal limits, no S3, no S4, no clicks, no rubs, no murmurs ABD:  Flat, positive bowel sounds normal in frequency in pitch, no bruits, no rebound, no guarding, no midline pulsatile mass, no hepatomegaly, no splenomegaly EXT:  2 plus pulses throughout, no edema, no cyanosis no clubbing SKIN:  No rashes no nodules NEURO:  Cranial nerves II through XII grossly intact, motor grossly intact throughout PSYCH:  Cognitively intact, oriented to person place and time   EKG:  EKG is ordered today. The ekg ordered today demonstrates sinus rhythm rate 60 bpm   Recent Labs: 09/27/2015: BUN 13; Creatinine, Ser 1.23; Potassium 3.6; Sodium 136 09/29/2015: Hemoglobin 12.7; Platelets 189   11/21/15 Sodium 138, potassium 3.7, BUN 22, creatinine 1.15 AST 16, ALT 15 Total cholesterol 183, triglycerides 145, HDL 47, LDL 107   Lipid Panel No results found for: CHOL, TRIG, HDL, CHOLHDL, VLDL, LDLCALC, LDLDIRECT    Wt Readings from Last 3 Encounters:  05/15/16 292 lb 6.4 oz (132.6 kg)  09/12/15 296 lb 11.2 oz (134.6 kg)      ASSESSMENT AND PLAN:  # Angina: [name] has classic symptoms of angina. Given his risk factors including age, hypertension, hyperlipidemia, and obesity, he has a high pretest probability of disease. Therefore, we will refer him for cardiac catheterization. He is to continue aspirin and simvastatin. If he doesn't fact have coronary disease his simvastatin will need to be increased. He has nitroglycerin to be taken as needed for chest pain that does  not resolve with rest. He did not like the nitroglycerin glycerin patches, and I have told him that he can stop this.  Risks and benefits of cardiac catheterization have been discussed with the patient.  The patient understands that risks included but are not limited to stroke (1 in 1000), death (1 in 1000), kidney failure [usually temporary] (1 in 500), bleeding (1 in 200), allergic reaction [possibly serious] (1 in 200). The patient understands and agrees to proceed.   # Hypertension: Blood pressure is well-controlled on losartan/HCTZ.  No changes at this time.   # Hyperlipidemia: Continue simvastatin as above.   # Snoring: Ms. Sahai was told that he may have sleep apnea when in the hospital. He does endorse snoring and feeling tired throughout the day. We will refer him  for sleep study.  # Obesity: We discussed the need to lose weight and increase his physical activity. We will stressed this more after his heart catheterization.   Current medicines are reviewed at length with the patient today.  The patient does not have concerns regarding medicines.  The following changes have been made:  Stop nitroglycerin patch  Labs/ tests ordered today include:   Orders Placed This Encounter  Procedures  . Protime-INR  . CBC  . BASIC METABOLIC PANEL WITH GFR  . EKG 12-Lead  . Split night study     Disposition:   FU with Cassadee Vanzandt C. Duke Salvia, MD, [facility] in 1 month.    This note was written with the assistance of speech recognition software.  Please excuse any transcriptional errors.  Signed, Sharona Rovner C. Duke Salvia, MD, [facility]  05/15/2016 3:56 PM    [facility]

## 2016-05-16 LAB — BASIC METABOLIC PANEL WITH GFR
BUN: 16 mg/dL (ref 7–25)
CHLORIDE: 102 mmol/L (ref 98–110)
CO2: 26 mmol/L (ref 20–31)
Calcium: 9.6 mg/dL (ref 8.6–10.3)
Creat: 1.31 mg/dL — ABNORMAL HIGH (ref 0.70–1.25)
GFR, EST NON AFRICAN AMERICAN: 55 mL/min — AB (ref >=60)
GFR, Est African American: 64 mL/min (ref >=60)
Glucose, Bld: 94 mg/dL (ref 65–99)
POTASSIUM: 4.1 mmol/L (ref 3.5–5.3)
SODIUM: 140 mmol/L (ref 135–146)

## 2016-05-16 LAB — CBC
HCT: 46.4 % (ref 38.5–50.0)
HEMOGLOBIN: 16.1 g/dL (ref 13.2–17.1)
MCH: 31.3 pg (ref 27.0–33.0)
MCHC: 34.7 g/dL (ref 32.0–36.0)
MCV: 90.1 fL (ref 80.0–100.0)
MPV: 8.8 fL (ref 7.5–12.5)
PLATELETS: 215 10*3/uL (ref 140–400)
RBC: 5.15 MIL/uL (ref 4.20–5.80)
RDW: 14.7 % (ref 11.0–15.0)
WBC: 9 10*3/uL (ref 3.8–10.8)

## 2016-05-16 LAB — PROTIME-INR
INR: 1.1
PROTHROMBIN TIME: 11.7 s — AB (ref 9.0–11.5)

## 2016-05-20 ENCOUNTER — Other Ambulatory Visit: Payer: Self-pay | Admitting: *Deleted

## 2016-05-20 DIAGNOSIS — I208 Other forms of angina pectoris: Secondary | ICD-10-CM

## 2016-05-21 ENCOUNTER — Encounter (HOSPITAL_COMMUNITY): Payer: Self-pay | Admitting: Cardiology

## 2016-05-21 ENCOUNTER — Ambulatory Visit (HOSPITAL_COMMUNITY)
Admission: RE | Admit: 2016-05-21 | Discharge: 2016-05-21 | Disposition: A | Discharge status: Home / Self Care | Payer: BC Managed Care – PPO | Source: Ambulatory Visit | Attending: Cardiology | Admitting: Cardiology

## 2016-05-21 ENCOUNTER — Encounter (HOSPITAL_COMMUNITY)
Admission: RE | Disposition: A | Discharge status: Home / Self Care | Payer: Self-pay | Source: Ambulatory Visit | Attending: Cardiology

## 2016-05-21 DIAGNOSIS — Z880 Allergy status to penicillin: Secondary | ICD-10-CM | POA: Insufficient documentation

## 2016-05-21 DIAGNOSIS — Z6839 Body mass index (BMI) 39.0-39.9, adult: Secondary | ICD-10-CM | POA: Insufficient documentation

## 2016-05-21 DIAGNOSIS — Z86010 Personal history of colonic polyps: Secondary | ICD-10-CM | POA: Insufficient documentation

## 2016-05-21 DIAGNOSIS — I25119 Atherosclerotic heart disease of native coronary artery with unspecified angina pectoris: Secondary | ICD-10-CM | POA: Insufficient documentation

## 2016-05-21 DIAGNOSIS — I209 Angina pectoris, unspecified: Secondary | ICD-10-CM | POA: Diagnosis present

## 2016-05-21 DIAGNOSIS — Z7982 Long term (current) use of aspirin: Secondary | ICD-10-CM | POA: Diagnosis not present

## 2016-05-21 DIAGNOSIS — I10 Essential (primary) hypertension: Secondary | ICD-10-CM | POA: Diagnosis not present

## 2016-05-21 DIAGNOSIS — Z96651 Presence of right artificial knee joint: Secondary | ICD-10-CM | POA: Diagnosis not present

## 2016-05-21 DIAGNOSIS — R0683 Snoring: Secondary | ICD-10-CM | POA: Diagnosis not present

## 2016-05-21 DIAGNOSIS — I2584 Coronary atherosclerosis due to calcified coronary lesion: Secondary | ICD-10-CM | POA: Diagnosis not present

## 2016-05-21 DIAGNOSIS — Z8249 Family history of ischemic heart disease and other diseases of the circulatory system: Secondary | ICD-10-CM | POA: Insufficient documentation

## 2016-05-21 DIAGNOSIS — I208 Other forms of angina pectoris: Secondary | ICD-10-CM

## 2016-05-21 DIAGNOSIS — N400 Enlarged prostate without lower urinary tract symptoms: Secondary | ICD-10-CM | POA: Diagnosis not present

## 2016-05-21 DIAGNOSIS — E785 Hyperlipidemia, unspecified: Secondary | ICD-10-CM | POA: Diagnosis present

## 2016-05-21 DIAGNOSIS — E669 Obesity, unspecified: Secondary | ICD-10-CM | POA: Insufficient documentation

## 2016-05-21 DIAGNOSIS — M1990 Unspecified osteoarthritis, unspecified site: Secondary | ICD-10-CM | POA: Diagnosis not present

## 2016-05-21 HISTORY — PX: CARDIAC CATHETERIZATION: SHX172

## 2016-05-21 SURGERY — LEFT HEART CATH AND CORONARY ANGIOGRAPHY
Anesthesia: LOCAL

## 2016-05-21 MED ORDER — SODIUM CHLORIDE 0.9% FLUSH: 3.0000 mL | INTRAVENOUS | Status: DC | PRN

## 2016-05-21 MED ORDER — LIDOCAINE HCL (PF) 1 % IJ SOLN
INTRAMUSCULAR | Status: AC
  Filled 2016-05-21: qty 30

## 2016-05-21 MED ORDER — SODIUM CHLORIDE 0.9% FLUSH: 3.0000 mL | Freq: Two times a day (BID) | INTRAVENOUS | Status: DC

## 2016-05-21 MED ORDER — ACETAMINOPHEN 325 MG PO TABS: 650.0000 mg | ORAL_TABLET | ORAL | Status: DC | PRN

## 2016-05-21 MED ORDER — VERAPAMIL HCL 2.5 MG/ML IV SOLN
INTRAVENOUS | Status: AC
  Filled 2016-05-21: qty 2

## 2016-05-21 MED ORDER — HEPARIN SODIUM (PORCINE) 1000 UNIT/ML IJ SOLN
INTRAMUSCULAR | Status: AC
  Filled 2016-05-21: qty 1

## 2016-05-21 MED ORDER — METOPROLOL TARTRATE 25 MG PO TABS: 25.0000 mg | ORAL_TABLET | Freq: Two times a day (BID) | ORAL | 3 refills | Status: DC

## 2016-05-21 MED ORDER — HEPARIN (PORCINE) IN NACL 2-0.9 UNIT/ML-% IJ SOLN
INTRAMUSCULAR | Status: DC | PRN
  Administered 2016-05-21: 1000 mL

## 2016-05-21 MED ORDER — IOPAMIDOL (ISOVUE-370) INJECTION 76%
INTRAVENOUS | Status: AC
  Filled 2016-05-21: qty 100

## 2016-05-21 MED ORDER — SODIUM CHLORIDE 0.9 % WEIGHT BASED INFUSION: 1.0000 mL/kg/h | INTRAVENOUS | Status: AC

## 2016-05-21 MED ORDER — LIDOCAINE HCL (PF) 1 % IJ SOLN
INTRAMUSCULAR | Status: DC | PRN
  Administered 2016-05-21: 3 mL via INTRADERMAL

## 2016-05-21 MED ORDER — MIDAZOLAM HCL 2 MG/2ML IJ SOLN
INTRAMUSCULAR | Status: DC | PRN
  Administered 2016-05-21: 2 mg via INTRAVENOUS

## 2016-05-21 MED ORDER — SODIUM CHLORIDE 0.9% FLUSH
3.0000 mL | INTRAVENOUS | Status: DC | PRN
Start: 2016-05-21 — End: 2016-05-21

## 2016-05-21 MED ORDER — IOPAMIDOL (ISOVUE-370) INJECTION 76%
INTRAVENOUS | Status: DC | PRN
  Administered 2016-05-21: 115 mL via INTRA_ARTERIAL

## 2016-05-21 MED ORDER — HEPARIN SODIUM (PORCINE) 1000 UNIT/ML IJ SOLN
INTRAMUSCULAR | Status: DC | PRN
  Administered 2016-05-21: 6000 [IU] via INTRAVENOUS

## 2016-05-21 MED ORDER — VERAPAMIL HCL 2.5 MG/ML IV SOLN
INTRAVENOUS | Status: DC | PRN
  Administered 2016-05-21: 10 mL via INTRA_ARTERIAL

## 2016-05-21 MED ORDER — SODIUM CHLORIDE 0.9 % IV SOLN
INTRAVENOUS | Status: DC
  Administered 2016-05-21: 07:00:00 via INTRAVENOUS

## 2016-05-21 MED ORDER — MIDAZOLAM HCL 2 MG/2ML IJ SOLN
INTRAMUSCULAR | Status: AC
Start: 2016-05-21 — End: 2016-05-21
  Filled 2016-05-21: qty 2

## 2016-05-21 MED ORDER — FENTANYL CITRATE (PF) 100 MCG/2ML IJ SOLN
INTRAMUSCULAR | Status: AC
  Filled 2016-05-21: qty 2

## 2016-05-21 MED ORDER — SODIUM CHLORIDE 0.9 % IV SOLN: 250.0000 mL | INTRAVENOUS | Status: DC | PRN

## 2016-05-21 MED ORDER — FENTANYL CITRATE (PF) 100 MCG/2ML IJ SOLN
INTRAMUSCULAR | Status: DC | PRN
  Administered 2016-05-21: 50 ug via INTRAVENOUS

## 2016-05-21 MED ORDER — ONDANSETRON HCL 4 MG/2ML IJ SOLN: 4.0000 mg | Freq: Four times a day (QID) | INTRAMUSCULAR | Status: DC | PRN

## 2016-05-21 MED ORDER — MORPHINE SULFATE (PF) 2 MG/ML IV SOLN: 2.0000 mg | INTRAVENOUS | Status: DC | PRN

## 2016-05-21 MED ORDER — ASPIRIN 81 MG PO CHEW: 81.0000 mg | CHEWABLE_TABLET | ORAL | Status: DC

## 2016-05-21 MED ORDER — HEPARIN (PORCINE) IN NACL 2-0.9 UNIT/ML-% IJ SOLN
INTRAMUSCULAR | Status: AC
  Filled 2016-05-21: qty 1000

## 2016-05-21 MED ORDER — ISOSORBIDE MONONITRATE ER 30 MG PO TB24: 30.0000 mg | ORAL_TABLET | Freq: Every day | ORAL | 3 refills | Status: DC

## 2016-05-21 SURGICAL SUPPLY — 10 items
CATH INFINITI 5FR ANG PIGTAIL (CATHETERS) ×1 IMPLANT
CATH OPTITORQUE TIG 4.0 5F (CATHETERS) ×1 IMPLANT
DEVICE RAD COMP TR BAND LRG (VASCULAR PRODUCTS) ×1 IMPLANT
GLIDESHEATH SLEND A-KIT 6F 22G (SHEATH) ×1 IMPLANT
KIT HEART LEFT (KITS) ×2 IMPLANT
PACK CARDIAC CATHETERIZATION (CUSTOM PROCEDURE TRAY) ×2 IMPLANT
SYR MEDRAD MARK V 150ML (SYRINGE) ×2 IMPLANT
TRANSDUCER W/STOPCOCK (MISCELLANEOUS) ×2 IMPLANT
TUBING CIL FLEX 10 FLL-RA (TUBING) ×2 IMPLANT
WIRE SAFE-T 1.5MM-J .035X260CM (WIRE) ×1 IMPLANT

## 2016-05-21 NOTE — H&P (View-Only) (Signed)
Cardiology Office Note   Date:  05/15/2016   ID:  Scott Johnson, DOB [birth year], MRN [id number]  PCP:   Duane Lope, MD  Cardiologist:   Chilton Si, MD   Chief Complaint  Patient presents with  . New Patient (Initial Visit)    chest tightness, Pt states no other Sx.      History of Present Illness: Scott Johnson is a 70 y.o. male with obesity, hypertension and hyperlipidemia who presents for an evaluation of chest pain.  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  Since then he continues to note 2-3-/10 Substernal chest tightness with exertion. It typically occurs after walking for some distance or when walking up an incline.  He also noted it when walking around in [place].  There is no associated shortness of breath, nausea, vomiting or diaphoresis.  It typically improves after resting for 5-7 minutes.  [name] saw Dr. Duane Lope on 05/13/16.  At that appointment he reported the chest tightness and he was started on a nitroglycerin patch.  He tried one on Sunday but developed a headache and stopped it.  On Monday he a more severe episode of chest tightness that was 5-6/10 and he thought this may have been related to the nitroglycerin. Therefore he has not taken any patches.  [name] does not report any lower extremity edema, orthopnea or PND.  He does not get any regular exercise.  At first he did not exercise due to knee pain. 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 Since then he reports that he is not exercising due to laziness.  He reports that when he was in the hospital for his knee replacement he was told that he may have sleep apnea. He reports feeling refreshed when he wakes up in the morning but does fall asleep easily throughout the day. He does report that he snores loudly.    Past Medical History:  Diagnosis Date  . Arthritis   . Benign prostate hyperplasia   . Chest pain   . Colon  polyp   . Essential hypertension 05/15/2016  . Hyperlipidemia   . Hypertension   . Ischemic chest pain (HCC) 05/15/2016  . Obesity   . Obesity (BMI 30-39.9) 05/15/2016  . SOB (shortness of breath)     Past Surgical History:  Procedure Laterality Date  . EYE SURGERY Bilateral 2015   Cataract removal  . 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 aspirin 81 MG tablet Take 81 mg by mouth daily.    . finasteride (PROSCAR) 5 MG tablet Take 5 mg by mouth daily.  3  . losartan-hydrochlorothiazide (HYZAAR) 100-25 MG tablet Take 1 tablet by mouth daily.  1  . nitroGLYCERIN (NITRODUR - DOSED IN MG/24 HR) 0.2 mg/hr patch Place 0.2 mg onto the skin daily.    . nitroGLYCERIN (NITROSTAT) 0.4 MG SL tablet Place 0.4 mg under the tongue every 5 (five) minutes as needed for chest pain.    . simvastatin (ZOCOR) 20 MG tablet Take 20 mg by mouth every evening.  3   No current facility-administered medications for this visit.     Allergies:   Penicillins    Social  History:  The patient  reports that he has never smoked. He has never used smokeless tobacco. He reports that he does not drink alcohol or use drugs.   Family History:  The patient's family history includes COPD in his sister; Cancer in his sister; Heart attack in his paternal grandmother; Heart disease in his maternal grandmother; Heart failure in his father, maternal grandfather, and mother; Hypertension in his mother.    ROS:  Please see the history of present illness.   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   All other systems are reviewed and negative.    PHYSICAL EXAM: VS:  BP 120/75   Pulse 60   Ht 6' (1.829 m)   Wt 292 lb 6.4 oz (132.6 kg)   BMI 39.66  kg/m  , BMI Body mass index is 39.66 kg/m. GENERAL:  Well appearing.  Obese HEENT:  Pupils equal round and reactive, fundi not visualized, oral mucosa unremarkable NECK:  No jugular venous distention, waveform within normal limits, carotid upstroke brisk and symmetric, no bruits, no thyromegaly LYMPHATICS:  No cervical adenopathy LUNGS:  Clear to auscultation bilaterally HEART:  RRR.  PMI not displaced or sustained,S1 and S2 within normal limits, no S3, no S4, no clicks, no rubs, no murmurs ABD:  Flat, positive bowel sounds normal in frequency in pitch, no bruits, no rebound, no guarding, no midline pulsatile mass, no hepatomegaly, no splenomegaly EXT:  2 plus pulses throughout, no edema, no cyanosis no clubbing SKIN:  No rashes no nodules NEURO:  Cranial nerves II through XII grossly intact, motor grossly intact throughout PSYCH:  Cognitively intact, oriented to person place and time   EKG:  EKG is ordered today. The ekg ordered today demonstrates sinus rhythm rate 60 bpm   Recent Labs: 09/27/2015: BUN 13; Creatinine, Ser 1.23; Potassium 3.6; Sodium 136 09/29/2015: Hemoglobin 12.7; Platelets 189   11/21/15 Sodium 138, potassium 3.7, BUN 22, creatinine 1.15 AST 16, ALT 15 Total cholesterol 183, triglycerides 145, HDL 47, LDL 107   Lipid Panel No results found for: CHOL, TRIG, HDL, CHOLHDL, VLDL, LDLCALC, LDLDIRECT    Wt Readings from Last 3 Encounters:  05/15/16 292 lb 6.4 oz (132.6 kg)  09/12/15 296 lb 11.2 oz (134.6 kg)      ASSESSMENT AND PLAN:  # Angina: [name] has classic symptoms of angina. Given his risk factors including age, hypertension, hyperlipidemia, and obesity, he has a high pretest probability of disease. Therefore, we will refer him for cardiac catheterization. He is to continue aspirin and simvastatin. If he doesn't fact have coronary disease his simvastatin will need to be increased. He has nitroglycerin to be taken as needed for chest pain that does  not resolve with rest. He did not like the nitroglycerin glycerin patches, and I have told him that he can stop this.  Risks and benefits of cardiac catheterization have been discussed with the patient.  The patient understands that risks included but are not limited to stroke (1 in 1000), death (1 in 1000), kidney failure [usually temporary] (1 in 500), bleeding (1 in 200), allergic reaction [possibly serious] (1 in 200). The patient understands and agrees to proceed.   # Hypertension: Blood pressure is well-controlled on losartan/HCTZ.  No changes at this time.   # Hyperlipidemia: Continue simvastatin as above.   # Snoring: Ms. Sahai was told that he may have sleep apnea when in the hospital. He does endorse snoring and feeling tired throughout the day. We will refer him  for sleep study.  # Obesity: We discussed the need to lose weight and increase his physical activity. We will stressed this more after his heart catheterization.   Current medicines are reviewed at length with the patient today.  The patient does not have concerns regarding medicines.  The following changes have been made:  Stop nitroglycerin patch  Labs/ tests ordered today include:   Orders Placed This Encounter  Procedures  . Protime-INR  . CBC  . BASIC METABOLIC PANEL WITH GFR  . EKG 12-Lead  . Split night study     Disposition:   FU with Seneca Hoback C. Duke Salvia, MD, [facility] in 1 month.    This note was written with the assistance of speech recognition software.  Please excuse any transcriptional errors.  Signed, Zaira Iacovelli C. Duke Salvia, MD, [facility]  05/15/2016 3:56 PM    [facility]

## 2016-05-21 NOTE — Discharge Instructions (Signed)
Radial Site Care °Refer to this sheet in the next few weeks. These instructions provide you with information about caring for yourself after your procedure. Your health care provider may also give you more specific instructions. Your treatment has been planned according to current medical practices, but problems sometimes occur. Call your health care provider if you have any problems or questions after your procedure. °WHAT TO EXPECT AFTER THE PROCEDURE °After your procedure, it is typical to have the following: °· Bruising at the radial site that usually fades within 1-2 weeks. °· Blood collecting in the tissue (hematoma) that may be painful to the touch. It should usually decrease in size and tenderness within 1-2 weeks. °HOME CARE INSTRUCTIONS °· Take medicines only as directed by your health care provider. °· You may shower 24-48 hours after the procedure or as directed by your health care provider. Remove the bandage (dressing) and gently wash the site with plain soap and water. Pat the area dry with a clean towel. Do not rub the site, because this may cause bleeding. °· Do not take baths, swim, or use a hot tub until your health care provider approves. °· Check your insertion site every day for redness, swelling, or drainage. °· Do not apply powder or lotion to the site. °· Do not flex or bend the affected arm for 24 hours or as directed by your health care provider. °· Do not push or pull heavy objects with the affected arm for 24 hours or as directed by your health care provider. °· Do not lift over 10 lb (4.5 kg) for 5 days after your procedure or as directed by your health care provider. °· Ask your health care provider when it is okay to: °¨ Return to work or school. °¨ Resume usual physical activities or sports. °¨ Resume sexual activity. °· Do not drive home if you are discharged the same day as the procedure. Have someone else drive you. °· You may drive 24 hours after the procedure unless otherwise  instructed by your health care provider. °· Do not operate machinery or power tools for 24 hours after the procedure. °· If your procedure was done as an outpatient procedure, which means that you went home the same day as your procedure, a responsible adult should be with you for the first 24 hours after you arrive home. °· Keep all follow-up visits as directed by your health care provider. This is important. °SEEK MEDICAL CARE IF: °· You have a fever. °· You have chills. °· You have increased bleeding from the radial site. Hold pressure on the site. °SEEK IMMEDIATE MEDICAL CARE IF: °· You have unusual pain at the radial site. °· You have redness, warmth, or swelling at the radial site. °· You have drainage (other than a small amount of blood on the dressing) from the radial site. °· The radial site is bleeding, and the bleeding does not stop after 30 minutes of holding steady pressure on the site. °· Your arm or hand becomes pale, cool, tingly, or numb. °  °This information is not intended to replace advice given to you by your health care provider. Make sure you discuss any questions you have with your health care provider. °  °Document Released: 11/09/2010 Document Revised: 10/28/2014 Document Reviewed: 04/25/2014 °Elsevier Interactive Patient Education ©2016 [employer] ° °

## 2016-05-21 NOTE — Interval H&P Note (Signed)
History and Physical Interval Note:  05/21/2016 7:27 AM  Scott Johnson  has presented today for surgery, with the diagnosis of cp concerning for Class III Angina. Principal Problem:   Angina, class III (HCC) Active Problems:   Essential hypertension   Hyperlipidemia  The various methods of treatment have been discussed with the patient and family. After consideration of risks, benefits and other options for treatment, the patient has consented to  Procedure(s): Left Heart Cath and Coronary Angiography (N/A) with possible Percutaneous Coronary Intervention as a surgical intervention .  The patient's history has been reviewed, patient examined, no change in status, stable for surgery.  I have reviewed the patient's chart and labs.  Questions were answered to the patient's satisfaction.    Cath Lab Visit (complete for each Cath Lab visit)  Clinical Evaluation Leading to the Procedure:   ACS: No.  Non-ACS:    Anginal Classification: CCS III  Anti-ischemic medical therapy: Minimal Therapy (1 class of medications)  Non-Invasive Test Results: No non-invasive testing performed  Prior CABG: No previous CABG  Ischemic Symptoms? CCS III (Marked limitation of ordinary activity) Anti-ischemic Medical Therapy? Minimal Therapy (1 class of medications) Non-invasive Test Results? No non-invasive testing performed Prior CABG? No Previous CABG   Patient Information:   1-2V CAD, no prox LAD  A (7)  Indication: 20; Score: 7   Patient Information:   1-2V-CAD with DS 50-60% With No FFR, No IVUS  I (3)  Indication: 21; Score: 3   Patient Information:   1-2V-CAD with DS 50-60% With FFR  A (7)  Indication: 22; Score: 7   Patient Information:   1-2V-CAD with DS 50-60% With FFR>0.8, IVUS not significant  I (2)  Indication: 23; Score: 2   Patient Information:   3V-CAD without LMCA With Abnormal LV systolic function  A (9)  Indication: 48; Score: 9   Patient  Information:   LMCA-CAD  A (9)  Indication: 49; Score: 9   Patient Information:   2V-CAD with prox LAD PCI  A (7)  Indication: 62; Score: 7   Patient Information:   2V-CAD with prox LAD CABG  A (8)  Indication: 62; Score: 8   Patient Information:   3V-CAD without LMCA With Low CAD burden(i.e., 3 focal stenoses, low SYNTAX score) PCI  A (7)  Indication: 63; Score: 7   Patient Information:   3V-CAD without LMCA With Low CAD burden(i.e., 3 focal stenoses, low SYNTAX score) CABG  A (9)  Indication: 63; Score: 9   Patient Information:   3V-CAD without LMCA E06c - Intermediate-high CAD burden (i.e., multiple diffuse lesions, presence of CTO, or high SYNTAX score) PCI  U (4)  Indication: 64; Score: 4   Patient Information:   3V-CAD without LMCA E06c - Intermediate-high CAD burden (i.e., multiple diffuse lesions, presence of CTO, or high SYNTAX score) CABG  A (9)  Indication: 64; Score: 9   Patient Information:   LMCA-CAD With Isolated LMCA stenosis  PCI  U (6)  Indication: 65; Score: 6   Patient Information:   LMCA-CAD With Isolated LMCA stenosis  CABG  A (9)  Indication: 65; Score: 9   Patient Information:   LMCA-CAD Additional CAD, low CAD burden (i.e., 1- to 2-vessel additional involvement, low SYNTAX score) PCI  U (5)  Indication: 66; Score: 5   Patient Information:   LMCA-CAD Additional CAD, low CAD burden (i.e., 1- to 2-vessel additional involvement, low SYNTAX score) CABG  A (9)  Indication: 66; Score: 9  Patient Information:   LMCA-CAD Additional CAD, intermediate-high CAD burden (i.e., 3-vessel involvement, presence of CTO, or high SYNTAX score) PCI  I (3)  Indication: 67; Score: 3   Patient Information:   LMCA-CAD Additional CAD, intermediate-high CAD burden (i.e., 3-vessel involvement, presence of CTO, or high SYNTAX score) CABG  A (9)  Indication: 67; Score: 9     Scott Johnson

## 2016-05-22 ENCOUNTER — Telehealth: Payer: Self-pay | Admitting: Cardiovascular Disease

## 2016-05-22 NOTE — Telephone Encounter (Signed)
New message        The pt had testing done yesterday and the pt needs surgery the pt would like to speak with a nurse.

## 2016-05-22 NOTE — Telephone Encounter (Signed)
Spoke with pt, he is meeting with dr Laneta Simmers next week to discuss bypass surgery. He wants to cancel his sleep study that is scheduled for September. He will keep his current appt with dr Duke Salvia 06-26-16. Will forward information to admin to cancel sleep study.

## 2016-05-29 ENCOUNTER — Telehealth: Payer: Self-pay | Admitting: Cardiovascular Disease

## 2016-05-29 ENCOUNTER — Institutional Professional Consult (permissible substitution) (INDEPENDENT_AMBULATORY_CARE_PROVIDER_SITE_OTHER): Payer: BC Managed Care – PPO | Admitting: Surgery

## 2016-05-29 ENCOUNTER — Other Ambulatory Visit: Payer: Self-pay | Admitting: *Deleted

## 2016-05-29 VITALS — BP 122/68 | HR 77 | Resp 20 | Ht 73.0 in | Wt 292.0 lb

## 2016-05-29 DIAGNOSIS — I2510 Atherosclerotic heart disease of native coronary artery without angina pectoris: Secondary | ICD-10-CM

## 2016-05-29 NOTE — Telephone Encounter (Signed)
New message    [facility]

## 2016-05-29 NOTE — Telephone Encounter (Signed)
Left message to call back  

## 2016-05-30 ENCOUNTER — Encounter: Payer: Self-pay | Admitting: Surgery

## 2016-05-30 NOTE — Progress Notes (Signed)
Cardiothoracic Surgery Consultation   PCP is  Melinda Crutch, MD Referring Provider is Leonie Man, MD  Chief Complaint  Patient presents with  . 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 He first noted it while walking across campus and he has continued to have 2-3/10 substernal chest tightness with walking long distances or up an incline. He was in [place] last month and had recurrent episodes while walking around the city. He saw his PCP after that and was referred to cardiology. Cath on 05/21/2016 showed a 90% calcified ostial dominant LCX stenosis as well as a 75% mid LAD stenosis after the major diagonal. The LM has a long, calcified 40% ostial to mid vessel stenosis. The RCA is small and non-dominant with 90% proximal stenosis. LVEF is normal. He has had no symptoms at rest or with doing activities around his house. He has no associated shortness of breath.  Past Medical History:  Diagnosis Date  . Arthritis   . Benign prostate hyperplasia   . Chest pain   . Colon polyp   . Essential hypertension 05/15/2016  . Hyperlipidemia   . Hypertension   . Ischemic chest pain ([place]) 05/15/2016  . Obesity   . Obesity (BMI 30-39.9) 05/15/2016  . SOB (shortness of breath)     Past Surgical History:  Procedure Laterality Date  . CARDIAC CATHETERIZATION N/A 05/21/2016   Procedure: Left Heart Cath and Coronary Angiography;  Surgeon: Leonie Man, MD;  Location: [facility];  Service: Cardiovascular;  Laterality: N/A;  . EYE SURGERY Bilateral 2015   Cataract removal  . 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/26/2015   Procedure: TOTAL KNEE ARTHROPLASTY, CEMENTED;  Surgeon: Meredith Pel, MD;  Location: [facility];  Service: Orthopedics;  Laterality: Right;    Family History  Problem Relation Age of Onset  . Heart failure Mother   . Hypertension Mother   . Heart failure Father   . Cancer Sister   . COPD Sister   . Heart disease Maternal Grandmother   . Heart failure Maternal Grandfather   . Heart attack Paternal Grandmother     Social History Social History  Substance Use Topics  . Smoking status: Never Smoker  . Smokeless tobacco: Never Used  . Alcohol use No    Current Outpatient Prescriptions  Medication Sig Dispense Refill  . aspirin 81 MG tablet Take 81 mg by mouth daily.    . finasteride (PROSCAR) 5 MG tablet Take 5 mg by mouth daily at 12 noon.   3  . isosorbide mononitrate (IMDUR) 30 MG 24 hr tablet Take 1 tablet (30 mg total) by mouth daily. 30 tablet 3  . losartan-hydrochlorothiazide (HYZAAR) 100-25 MG tablet Take 1 tablet by mouth every morning.   1  . metoprolol tartrate (LOPRESSOR) 25 MG tablet Take 1 tablet (25 mg total) by mouth 2 (two) times daily. 60 tablet 3  . nitroGLYCERIN (NITROSTAT) 0.4 MG SL tablet Place 0.4 mg under the tongue every 5 (five) minutes as needed for chest pain.    . simvastatin (ZOCOR) 20 MG tablet Take 20 mg by mouth every evening.  3   No current facility-administered medications  for this visit.     Allergies  Allergen Reactions  . Penicillins Rash    Ampicillin, Has patient had a PCN reaction causing immediate rash, facial/tongue/throat swelling, SOB or lightheadedness with hypotension: Yes Has patient had a PCN reaction causing severe rash involving mucus membranes or skin necrosis: No Has patient had a PCN reaction that required hospitalization No Has patient had a PCN reaction occurring within the last 10 years: No If all of the above answers are "NO", then may proceed with Cephalosporin use.     Review of Systems  Constitutional: Negative for activity change, appetite change, fatigue, fever and unexpected  weight change.  HENT: Negative.   Eyes: Negative.   Respiratory: Negative for shortness of breath.   Cardiovascular: Positive for chest pain. Negative for palpitations and leg swelling.  Gastrointestinal: Negative.   Endocrine: Negative.   Genitourinary: Negative.   Musculoskeletal: Positive for arthralgias.  Skin: Negative.   Neurological: Negative.   Hematological: Negative.   Psychiatric/Behavioral: Negative.     BP 122/68 (BP Location: Right Arm, Patient Position: Sitting, Cuff Size: Large)   Pulse 77   Resp 20   Ht _0  (1.854 m)   Wt 292 lb (132.5 kg)   SpO2 98% Comment: RA  BMI 38.52 kg/m  Physical Exam  Constitutional: He is oriented to person, place, and time.  Obese gentleman in no distress  HENT:  Head: Normocephalic and atraumatic.  Mouth/Throat: Oropharynx is clear and moist.  Eyes: EOM are normal. Pupils are equal, round, and reactive to light.  Neck: Normal range of motion. Neck supple. No JVD present. No thyromegaly present.  Cardiovascular: Normal rate, regular rhythm, normal heart sounds and intact distal pulses.   No murmur heard. Pulmonary/Chest: Effort normal and breath sounds normal. No respiratory distress.  Abdominal: Soft. Bowel sounds are normal. There is no tenderness.  Musculoskeletal: Normal range of motion. He exhibits no edema.  Scar from right TKR.  Lymphadenopathy:    He has no cervical adenopathy.  Neurological: He is alert and oriented to person, place, and time. He has normal strength. No cranial nerve deficit or sensory deficit.  Skin: Skin is warm and dry.  Psychiatric: He has a normal mood and affect.     Diagnostic Tests:  Leonie Man, MD (Primary)    Procedures   Left Heart Cath and Coronary Angiography  Conclusion     Ost LM to LM lesion, 40 %stenosed.  Ost Cx lesion, 90 %stenosed.  Ost Ramus lesion, 90 %stenosed. Ost 1st Mrg to 1st Mrg lesion, 90 %stenosed.  Ost LAD to Prox LAD lesion, 40 %stenosed. Mid LAD  lesion, 75 %stenosed.  Prox nondominant RCA lesion, 90 %stenosed.  Dist LAD lesion, 40 %stenosed.  The left ventricular systolic function is normal. LV end diastolic pressure is normal.    Patient has severe calcified ostial dominant Circumflex disease involving a Ramus Intermedius, OM branch in addition to calcified mid LAD stenosis after major diagonal branch. His relatively cloacal left main with heavy calcification. The nondominant right coronary artery is also severely diseased.  Patient's best option is to be referred for coronary artery bypass grafting given his dominant circumflex has severe disease ostially. This is not amenable PCI as it is no landing zone for stent.  Plan: Turn to short stay holding area post catheterization care. TR band removal per protocol. We will consult CT surgery to arrange outpatient consultation to schedule CABG. Will change from nitroglycerin patch to Imdur, and add metoprolol.  Glenetta Hew, M.D., M.S. Interventional Cardiologist   Pager # [phone] Phone # [phone] [address] [place],  [address]    Indications   Angina, class III ([place]) [I20.9 (ICD-10-CM)]  Procedural Details/Technique   Technical Details PCP: Melinda Crutch, MD CARDIOLOGIST: Skeet Latch, MD  70 y.o. male with obesity, hypertension and hyperlipidemia who was seen by Dr. Oval Linsey in consultation on July 26 with signs symptoms concerning for progressively worsening angina now of at least class III by description. Given his cardiac risk factors of age, hypertension and hyperlipidemia and symptoms consistent with class III angina, he is referred for invasive evaluation cardiac catheterization plus minus PCI.  PROCEDURE:  Time Out: Verified patient identification, verified procedure, site/side was marked, verified correct patient position, special equipment/implants available, medications/allergies/relevent history reviewed, required  imaging and test results available. Performed. Consent Signed.   Access:  RIGHT Radial Artery: 6 Fr sheath -- Seldinger technique using Angiocath Micropuncture Kit * 10 mL radial cocktail IA; 6000 Units IV Heparin  Left Heart Catheterization: 5 Fr Catheters advanced or exchanged over a J-wire under direct fluoroscopic guidance into the ascending aorta; TIG 4.0 catheter advanced first.  Left & Right Coronary Artery Cineangiography: TIG 4.0 Catheter  LV Hemodynamics (LV Gram): Angled Pigtail Catheter  Radial Sheath(s) removed in the Cath Lab with TR Band placed for hemostasis.   TR Band: 0810 Hours; 16 mL air  MEDICATIONS * 50 g IV fentanyl, 2 mgIV Versed * SQ Lidocaine 69m * Radial Cocktail: 3 mg Verapamil * Isovue Contrast: 115 * Heparin: 6000 Units   Estimated blood loss <50 mL. . During this procedure the patient was administered the following to achieve and maintain moderate conscious sedation: Versed 2 mg, Fentanyl 50 mcg, while the patient's heart rate, blood pressure, and oxygen saturation were continuously monitored. The period of conscious sedation was 32 minutes, of which I was present face-to-face 100% of this time.    Coronary Findings   Dominance: Left  Left Main  Vessel is large. Near separate ostia The vessel is moderately calcified.  Ost LM to LM lesion, 40% stenosed. The lesion is calcified.  Left Anterior Descending  Ost LAD to Prox LAD lesion, 40% stenosed. The lesion is moderately calcified.  Mid LAD lesion, 75% stenosed. The lesion is type C and distal to major branch. The lesion is moderately calcified.  Dist LAD lesion, 40% stenosed. The lesion is tubular. Diffuse  Lateral First Diagonal Branch  Vessel is moderate in size.  First Septal Branch  Vessel is small in size.  Second Diagonal Branch  Vessel is small in size.  Second Septal Branch  Vessel is small in size.  Third Septal Branch  Vessel is small in size.  Ramus Intermedius  Vessel is  moderate in size.  Ost Ramus lesion, 90% stenosed. The lesion is located at the major branch. The lesion is moderately calcified.  Left Circumflex  Ost Cx lesion, 90% stenosed. The lesion is type C, located at the major branch and focal. The lesion is severely calcified.  First Obtuse Marginal Branch  Vessel is small in size.  Ost 1st Mrg to 1st Mrg lesion, 90% stenosed. The lesion is located at the major branch. The lesion is calcified.  Second Obtuse Marginal Branch  Vessel is moderate in size.  Lateral Third Obtuse Marginal Branch  Vessel is small in size.  Left Posterior Descending Artery  Vessel is moderate in size.  First Left Posterolateral Branch  Vessel is small in size.  Second Left Posterolateral Branch  Vessel is small in size.  Right Coronary Artery  Vessel is small.  Prox RCA lesion, 90% stenosed. The lesion is tubular and eccentric.  Wall Motion              Left Heart   Left Ventricle The left ventricular size is normal. The left ventricular systolic function is normal. LV end diastolic pressure is normal. No regional wall motion abnormalities.    Mitral Valve There is no mitral valve regurgitation.    Aortic Valve There is no aortic valve stenosis. The aortic valve is calcified. There is normal aortic valve motion.    Coronary Diagrams   Diagnostic Diagram     Implants     No implant documentation for this case.  PACS Images   Show images for Cardiac catheterization   Link to Procedure Log   Procedure Log    Hemo Data   Flowsheet Row Most Recent Value  AO Systolic Pressure 383 mmHg  AO Diastolic Pressure 62 mmHg  AO Mean 83 mmHg  LV Systolic Pressure 818 mmHg  LV Diastolic Pressure -1 mmHg  LV EDP 18 mmHg  Arterial Occlusion Pressure Extended Systolic Pressure 403 mmHg  Arterial Occlusion Pressure Extended Diastolic Pressure 59 mmHg  Arterial Occlusion Pressure Extended Mean Pressure 85 mmHg  Left Ventricular Apex Extended Systolic  Pressure 754 mmHg  Left Ventricular Apex Extended Diastolic Pressure 2 mmHg  Left Ventricular Apex Extended EDP Pressure 19 mmHg     Impression:  This gentleman has moderate left main and severe 3 vessel coronary artery disease with calcific lesions in the left main, ostial LCX and LAD. He has lifestyle-limiting exertional angina and I agree that CABG is indicated to relieve his symptoms and improve his long-term prognosis. I have reviewed the cath findings with him. I discussed the operative procedure with him including alternatives, benefits and risks; including but not limited to bleeding, blood transfusion, infection, stroke, myocardial infarction, graft failure, heart block requiring a permanent pacemaker, organ dysfunction, and death.  Roda Shutters understands and agrees to proceed.  He would like wait until 8/24 to do surgery so that he can get his classes going at [employer] with a minimum of disruption.    Plan:  CABG surgery on Thursday 06/13/2016  I spent 80 minutes performing this consultation and > 50% of this time was spent face to face counseling and coordinating the care of this patient's severe multi-vessel coronary artery disease.  Gaye Pollack, MD [facility] [phone]

## 2016-05-31 NOTE — Telephone Encounter (Signed)
Notes Recorded by Burnell BlanksMelinda B Kayvion Arneson on 05/30/2016 at 9:13 AM EDT Patient aware of results

## 2016-06-10 ENCOUNTER — Ambulatory Visit (HOSPITAL_COMMUNITY)
Admission: RE | Admit: 2016-06-10 | Discharge: 2016-06-10 | Disposition: A | Discharge status: Home / Self Care | Payer: BC Managed Care – PPO | Source: Ambulatory Visit | Attending: Surgery | Admitting: Surgery

## 2016-06-10 ENCOUNTER — Other Ambulatory Visit (HOSPITAL_COMMUNITY): Payer: BC Managed Care – PPO

## 2016-06-10 DIAGNOSIS — I2510 Atherosclerotic heart disease of native coronary artery without angina pectoris: Secondary | ICD-10-CM | POA: Insufficient documentation

## 2016-06-10 DIAGNOSIS — Z01818 Encounter for other preprocedural examination: Secondary | ICD-10-CM | POA: Insufficient documentation

## 2016-06-10 DIAGNOSIS — I6523 Occlusion and stenosis of bilateral carotid arteries: Secondary | ICD-10-CM | POA: Diagnosis not present

## 2016-06-10 LAB — VAS US DOPPLER PRE CABG
LCCADSYS: -113 cm/s
LCCAPDIAS: 12 cm/s
LEFT ECA DIAS: -7 cm/s
LEFT VERTEBRAL DIAS: -5 cm/s
LICADDIAS: -22 cm/s
LICAPDIAS: -17 cm/s
Left CCA dist dias: -18 cm/s
Left CCA prox sys: 89 cm/s
Left ICA dist sys: -71 cm/s
Left ICA prox sys: -66 cm/s
RCCAPDIAS: -8 cm/s
RCCAPSYS: -79 cm/s
RIGHT VERTEBRAL DIAS: 7 cm/s
Right cca dist sys: -77 cm/s

## 2016-06-10 NOTE — Pre-Procedure Instructions (Signed)
Scott Johnson  06/10/2016      [facility] [phone] [place] - [address] [facility] [address] [facility] [address] [place] [id number] Phone: [phone] Fax: [phone]    Your procedure is scheduled on Thurs, Aug 24 @ 7:30 AM  [facility] at 5:30 AM  Call this number if you have problems the morning of surgery:  [phone]   Remember:  Do not eat food or drink liquids after midnight.  Take these medicines the morning of surgery with A SIP OF WATER Metoprolol(Lopressor)              No Goody's,BC's,Aleve,Advil,Motrin,Ibuprofen,Fish Oil,or any Herbal Medications.    Do not wear jewelry.  Do not wear lotions, powders, or colognes.  Men may shave face and neck.  Do not bring valuables to the hospital.  [facility] is not responsible for any belongings or valuables.  Contacts, dentures or bridgework may not be worn into surgery.  Leave your suitcase in the car.  After surgery it may be brought to your room.  For patients admitted to the hospital, discharge time will be determined by your treatment team.  Patients discharged the day of surgery will not be allowed to drive home.    [facility] Pre[facility]  Before surgery, you can play an important role.  Because skin is not sterile, your skin needs to be as free of germs as possible.  You can reduce the number of germs on you skin by washing with CHG (chlorahexidine gluconate) soap before surgery.  CHG is an antiseptic cleaner which kills germs and bonds with the skin to continue killing germs even after washing.  Please DO NOT use if you have an allergy to CHG or antibacterial soaps.  If your skin becomes reddened/irritated stop using the CHG and inform your nurse when you arrive at Short Stay.  Do not shave (including legs and underarms) for at least 48 hours prior to the first CHG shower.  You may shave your face.  Please follow  these instructions carefully:   1.  Shower with CHG Soap the night before surgery and the                                morning of Surgery.  2.  If you choose to wash your hair, wash your hair first as usual with your       normal shampoo.  3.  After you shampoo, rinse your hair and body thoroughly to remove the                      Shampoo.  4.  Use CHG as you would any other liquid soap.  You can apply chg directly       to the skin and wash gently with scrungie or a clean washcloth.  5.  Apply the CHG Soap to your body ONLY FROM THE NECK DOWN.        Do not use on open wounds or open sores.  Avoid contact with your eyes,       ears, mouth and genitals (private parts).  Wash genitals (private parts)       with your normal soap.  6.  Wash thoroughly, paying special attention to the area where your surgery  will be performed.  7.  Thoroughly rinse your body with warm water from the neck down.  8.  DO NOT shower/wash with your normal soap after using and rinsing off       the CHG Soap.  9.  Pat yourself dry with a clean towel.            10.  Wear clean pajamas.            11.  Place clean sheets on your bed the night of your first shower and do not        sleep with pets.  Day of Surgery  Do not apply any lotions/deoderants the morning of surgery.  Please wear clean clothes to the hospital/surgery center.     Please read over the following fact sheets that you were given. Pain Booklet, Coughing and Deep Breathing, MRSA Information and Surgical Site Infection Prevention

## 2016-06-10 NOTE — Progress Notes (Signed)
Pre-op Cardiac Surgery  Carotid Findings:   Findings are consistent with a 1-39 percent stenosis involving the right internal carotid artery and the left internal carotid artery. The vertebral arteries demonstrate antegrade flow.  Upper Extremity Right Left  Brachial Pressures 131 147  Radial Waveforms Triphasic Triphasic  Ulnar Waveforms Triphasic Triphasic  Palmar Arch (Allen's Test) Waveforms remain within normal limits with radial and ulnar compression Waveforms remain within normal limits with radial compression and decrease greater than 50 percent with ulnar compression     Lower  Extremity Right Left  Dorsalis Pedis Triphasic Triphasic  Posterior Tibial Triphasic Triphasic    Findings:   Bilateral pedal artery waveforms are within normal limits at rest.   06/10/16 10:03 AM Olen CordialGreg Swanson Farnell RVT

## 2016-06-11 ENCOUNTER — Encounter (HOSPITAL_COMMUNITY): Payer: BC Managed Care – PPO

## 2016-06-11 ENCOUNTER — Encounter (HOSPITAL_COMMUNITY): Payer: Self-pay

## 2016-06-11 ENCOUNTER — Other Ambulatory Visit (HOSPITAL_COMMUNITY): Payer: BC Managed Care – PPO

## 2016-06-11 ENCOUNTER — Ambulatory Visit (HOSPITAL_COMMUNITY)
Admission: RE | Admit: 2016-06-11 | Discharge: 2016-06-11 | Disposition: A | Discharge status: Home / Self Care | Payer: BC Managed Care – PPO | Source: Ambulatory Visit | Attending: Surgery | Admitting: Surgery

## 2016-06-11 ENCOUNTER — Encounter (HOSPITAL_COMMUNITY)
Admission: RE | Admit: 2016-06-11 | Discharge: 2016-06-11 | Disposition: A | Discharge status: Home / Self Care | Payer: BC Managed Care – PPO | Source: Ambulatory Visit | Attending: Surgery | Admitting: Surgery

## 2016-06-11 DIAGNOSIS — I2510 Atherosclerotic heart disease of native coronary artery without angina pectoris: Secondary | ICD-10-CM | POA: Insufficient documentation

## 2016-06-11 DIAGNOSIS — Z01818 Encounter for other preprocedural examination: Secondary | ICD-10-CM | POA: Diagnosis not present

## 2016-06-11 DIAGNOSIS — Z01812 Encounter for preprocedural laboratory examination: Secondary | ICD-10-CM | POA: Insufficient documentation

## 2016-06-11 DIAGNOSIS — I700 Atherosclerosis of aorta: Secondary | ICD-10-CM | POA: Diagnosis not present

## 2016-06-11 DIAGNOSIS — Z0183 Encounter for blood typing: Secondary | ICD-10-CM | POA: Diagnosis not present

## 2016-06-11 HISTORY — DX: Pain in unspecified joint: M25.50

## 2016-06-11 HISTORY — DX: Atherosclerotic heart disease of native coronary artery without angina pectoris: I25.10

## 2016-06-11 HISTORY — DX: Personal history of other diseases of the respiratory system: Z87.09

## 2016-06-11 LAB — URINALYSIS, ROUTINE W REFLEX MICROSCOPIC
Bilirubin Urine: NEGATIVE
GLUCOSE, UA: NEGATIVE mg/dL
HGB URINE DIPSTICK: NEGATIVE
KETONES UR: NEGATIVE mg/dL
LEUKOCYTES UA: NEGATIVE
Nitrite: NEGATIVE
PROTEIN: NEGATIVE mg/dL
Specific Gravity, Urine: 1.019 (ref 1.005–1.030)
pH: 6 (ref 5.0–8.0)

## 2016-06-11 LAB — PULMONARY FUNCTION TEST
DL/VA % pred: 102 %
DL/VA: 4.88 ml/min/mmHg/L
DLCO UNC % PRED: 74 %
DLCO unc: 27.04 ml/min/mmHg
FEF 25-75 PRE: 3.16 L/s
FEF 25-75 Post: 4.24 L/sec
FEF2575-%CHANGE-POST: 34 %
FEF2575-%Pred-Post: 152 %
FEF2575-%Pred-Pre: 113 %
FEV1-%CHANGE-POST: 8 %
FEV1-%PRED-POST: 86 %
FEV1-%PRED-PRE: 79 %
FEV1-POST: 3.17 L
FEV1-PRE: 2.91 L
FEV1FVC-%CHANGE-POST: 1 %
FEV1FVC-%Pred-Pre: 112 %
FEV6-%CHANGE-POST: 7 %
FEV6-%PRED-PRE: 74 %
FEV6-%Pred-Post: 79 %
FEV6-PRE: 3.48 L
FEV6-Post: 3.75 L
FEV6FVC-%Change-Post: 0 %
FEV6FVC-%PRED-PRE: 104 %
FEV6FVC-%Pred-Post: 105 %
FVC-%CHANGE-POST: 6 %
FVC-%PRED-POST: 75 %
FVC-%Pred-Pre: 70 %
FVC-POST: 3.75 L
FVC-Pre: 3.5 L
POST FEV6/FVC RATIO: 100 %
PRE FEV6/FVC RATIO: 99 %
Post FEV1/FVC ratio: 84 %
Pre FEV1/FVC ratio: 83 %
RV % pred: 80 %
RV: 2.11 L
TLC % pred: 79 %
TLC: 6.05 L

## 2016-06-11 LAB — COMPREHENSIVE METABOLIC PANEL
ALBUMIN: 4 g/dL (ref 3.5–5.0)
ALT: 19 U/L (ref 17–63)
AST: 23 U/L (ref 15–41)
Alkaline Phosphatase: 74 U/L (ref 38–126)
Anion gap: 10 (ref 5–15)
BUN: 19 mg/dL (ref 6–20)
CHLORIDE: 110 mmol/L (ref 101–111)
CO2: 20 mmol/L — AB (ref 22–32)
CREATININE: 1.39 mg/dL — AB (ref 0.61–1.24)
Calcium: 9.7 mg/dL (ref 8.9–10.3)
GFR calc non Af Amer: 50 mL/min — ABNORMAL LOW (ref >60)
GFR, EST AFRICAN AMERICAN: 58 mL/min — AB (ref >60)
GLUCOSE: 122 mg/dL — AB (ref 65–99)
Potassium: 3.6 mmol/L (ref 3.5–5.1)
SODIUM: 140 mmol/L (ref 135–145)
Total Bilirubin: 2.1 mg/dL — ABNORMAL HIGH (ref 0.3–1.2)
Total Protein: 7.3 g/dL (ref 6.5–8.1)

## 2016-06-11 LAB — CBC
HCT: 45.4 % (ref 39.0–52.0)
Hemoglobin: 15.8 g/dL (ref 13.0–17.0)
MCH: 31.5 pg (ref 26.0–34.0)
MCHC: 34.8 g/dL (ref 30.0–36.0)
MCV: 90.4 fL (ref 78.0–100.0)
PLATELETS: 218 10*3/uL (ref 150–400)
RBC: 5.02 MIL/uL (ref 4.22–5.81)
RDW: 14 % (ref 11.5–15.5)
WBC: 10.1 10*3/uL (ref 4.0–10.5)

## 2016-06-11 LAB — BLOOD GAS, ARTERIAL
ACID-BASE DEFICIT: 0.1 mmol/L (ref 0.0–2.0)
Bicarbonate: 23.8 mEq/L (ref 20.0–24.0)
DRAWN BY: 205171
FIO2: 0.21
O2 SAT: 96.6 %
PATIENT TEMPERATURE: 98.6
PCO2 ART: 37.2 mmHg (ref 35.0–45.0)
TCO2: 25 mmol/L (ref 0–100)
pH, Arterial: 7.423 (ref 7.350–7.450)
pO2, Arterial: 89.3 mmHg (ref 80.0–100.0)

## 2016-06-11 LAB — TYPE AND SCREEN
ABO/RH(D): O POS
Antibody Screen: NEGATIVE

## 2016-06-11 LAB — PROTIME-INR
INR: 1.11
Prothrombin Time: 14.3 seconds (ref 11.4–15.2)

## 2016-06-11 LAB — SURGICAL PCR SCREEN
MRSA, PCR: NEGATIVE
Staphylococcus aureus: NEGATIVE

## 2016-06-11 LAB — APTT: aPTT: 37 seconds — ABNORMAL HIGH (ref 24–36)

## 2016-06-11 MED ORDER — ALBUTEROL SULFATE (2.5 MG/3ML) 0.083% IN NEBU
2.5000 mg | INHALATION_SOLUTION | Freq: Once | RESPIRATORY_TRACT | Status: AC
  Administered 2016-06-11: 2.5 mg via RESPIRATORY_TRACT

## 2016-06-11 MED ORDER — CHLORHEXIDINE GLUCONATE 4 % EX LIQD: 30.0000 mL | CUTANEOUS | Status: DC

## 2016-06-11 NOTE — Progress Notes (Signed)
   06/11/16 0900  OBSTRUCTIVE SLEEP APNEA  Have you ever been diagnosed with sleep apnea through a sleep study? No (sleep study was scheduled for Sept 2017 but too soon after CABG)  Do you snore loudly (loud enough to be heard through closed doors)?  1  Do you often feel tired, fatigued, or sleepy during the daytime (such as falling asleep during driving or talking to someone)? 0  Has anyone observed you stop breathing during your sleep? 0  Do you have, or are you being treated for high blood pressure? 1  BMI more than 35 kg/m2? 1  Age > 50 (1-yes) 1  Neck circumference greater than:Male 16 inches or larger, Male 17inches or larger? 1 38(17.5)  Male Gender (Yes=1) 1  Obstructive Sleep Apnea Score 6  Score 5 or greater  Results sent to PCP

## 2016-06-11 NOTE — Progress Notes (Addendum)
Cardiologist is Dr.Tiffany Randoloph,last visit 2 wks ago  Medical Md is Dr.Charles Tenny CrawRoss  Echo denies ever having one  Stress test done > 10 yrs ago  Heart cath report in epic from 05-21-16  EKG in epic from 05-21-16  CXR denies in past 2 wks  PFT confirmed that was today at 0800  Dopplers done 06/10/16

## 2016-06-12 LAB — HEMOGLOBIN A1C
HEMOGLOBIN A1C: 5.4 % (ref 4.8–5.6)
MEAN PLASMA GLUCOSE: 108 mg/dL

## 2016-06-12 MED ORDER — SODIUM CHLORIDE 0.9 % IV SOLN
INTRAVENOUS | Status: AC
  Administered 2016-06-13: 70 mL/h via INTRAVENOUS
  Filled 2016-06-12: qty 40

## 2016-06-12 MED ORDER — PHENYLEPHRINE HCL 10 MG/ML IJ SOLN
30.0000 ug/min | INTRAVENOUS | Status: AC
  Administered 2016-06-13: 10 ug/min via INTRAVENOUS
  Filled 2016-06-12: qty 2

## 2016-06-12 MED ORDER — DEXTROSE 5 % IV SOLN
0.0000 ug/min | INTRAVENOUS | Status: DC
  Filled 2016-06-12: qty 4

## 2016-06-12 MED ORDER — SODIUM CHLORIDE 0.9 % IV SOLN
INTRAVENOUS | Status: DC
  Filled 2016-06-12: qty 30

## 2016-06-12 MED ORDER — CHLORHEXIDINE GLUCONATE 0.12 % MT SOLN
15.0000 mL | Freq: Once | OROMUCOSAL | Status: AC
  Administered 2016-06-13: 15 mL via OROMUCOSAL
  Filled 2016-06-12: qty 15

## 2016-06-12 MED ORDER — DEXMEDETOMIDINE HCL IN NACL 400 MCG/100ML IV SOLN
0.1000 ug/kg/h | INTRAVENOUS | Status: AC
  Administered 2016-06-13: .2 ug/kg/h via INTRAVENOUS
  Filled 2016-06-12: qty 100

## 2016-06-12 MED ORDER — SODIUM CHLORIDE 0.9 % IV SOLN
INTRAVENOUS | Status: AC
  Administered 2016-06-13: 1.2 [IU]/h via INTRAVENOUS
  Filled 2016-06-12: qty 2.5

## 2016-06-12 MED ORDER — LEVOFLOXACIN IN D5W 500 MG/100ML IV SOLN
500.0000 mg | INTRAVENOUS | Status: AC
  Administered 2016-06-13: 500 mg via INTRAVENOUS
  Filled 2016-06-12: qty 100

## 2016-06-12 MED ORDER — METOPROLOL TARTRATE 12.5 MG HALF TABLET: 12.5000 mg | ORAL_TABLET | Freq: Once | ORAL | Status: DC

## 2016-06-12 MED ORDER — VANCOMYCIN HCL 10 G IV SOLR
1500.0000 mg | INTRAVENOUS | Status: AC
  Administered 2016-06-13: 1500 mg via INTRAVENOUS
  Filled 2016-06-12 (×2): qty 1500

## 2016-06-12 MED ORDER — NITROGLYCERIN IN D5W 200-5 MCG/ML-% IV SOLN
2.0000 ug/min | INTRAVENOUS | Status: AC
  Administered 2016-06-13: 5 ug/min via INTRAVENOUS
  Filled 2016-06-12: qty 250

## 2016-06-12 MED ORDER — DOPAMINE-DEXTROSE 3.2-5 MG/ML-% IV SOLN
0.0000 ug/kg/min | INTRAVENOUS | Status: DC
  Filled 2016-06-12: qty 250

## 2016-06-12 MED ORDER — POTASSIUM CHLORIDE 2 MEQ/ML IV SOLN
80.0000 meq | INTRAVENOUS | Status: DC
  Filled 2016-06-12: qty 40

## 2016-06-12 MED ORDER — PLASMA-LYTE 148 IV SOLN
INTRAVENOUS | Status: AC
  Administered 2016-06-13: 09:00:00
  Filled 2016-06-12: qty 2.5

## 2016-06-12 MED ORDER — MAGNESIUM SULFATE 50 % IJ SOLN
40.0000 meq | INTRAMUSCULAR | Status: DC
  Filled 2016-06-12: qty 10

## 2016-06-12 NOTE — H&P (Signed)
[address]       [place] [address]             [phone]      Cardiothoracic Surgery History and Physical    PCP is  Melinda Crutch, MD Referring Provider is Leonie Man, MD      Chief Complaint  Patient presents with  . 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 He first noted it while walking across campus and he has continued to have 2-3/10 substernal chest tightness with walking long distances or up an incline. He was in [place] last month and had recurrent episodes while walking around the city. He saw his PCP after that and was referred to cardiology. Cath on 05/21/2016 showed a 90% calcified ostial dominant LCX stenosis as well as a 75% mid LAD stenosis after the major diagonal. The LM has a long, calcified 40% ostial to mid vessel stenosis. The RCA is small and non-dominant with 90% proximal stenosis. LVEF is normal. He has had no symptoms at rest or with doing activities around his house. He has no associated shortness of breath.      Past Medical History:  Diagnosis Date  . Arthritis   . Benign prostate hyperplasia   . Chest pain   . Colon polyp   . Essential hypertension 05/15/2016  . Hyperlipidemia   . Hypertension   . Ischemic chest pain ([place]) 05/15/2016  . Obesity   . Obesity (BMI 30-39.9) 05/15/2016  . SOB (shortness of breath)          Past Surgical History:  Procedure Laterality Date  . CARDIAC CATHETERIZATION N/A 05/21/2016   Procedure: Left Heart Cath and Coronary Angiography;  Surgeon: Leonie Man, MD;  Location: [facility];  Service: Cardiovascular;  Laterality: N/A;  . EYE SURGERY Bilateral 2015   Cataract removal  . TOTAL KNEE  ARTHROPLASTY Right 09/26/2015  . 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/26/2015   Procedure: TOTAL KNEE ARTHROPLASTY, CEMENTED;  Surgeon: Meredith Pel, MD;  Location: [facility];  Service: Orthopedics;  Laterality: Right;         Family History  Problem Relation Age of Onset  . Heart failure Mother   . Hypertension Mother   . Heart failure Father   . Cancer Sister   . COPD Sister   . Heart disease Maternal Grandmother   . Heart failure Maternal Grandfather   . Heart attack Paternal Grandmother     Social History     Social History  Substance Use Topics  . Smoking status: Never Smoker  . Smokeless tobacco: Never Used  . Alcohol use No          Current Outpatient Prescriptions  Medication Sig Dispense Refill  . aspirin 81 MG tablet Take 81 mg by mouth daily.    . finasteride (PROSCAR) 5 MG tablet Take 5 mg by mouth daily at 12 noon.   3  . isosorbide mononitrate (IMDUR) 30 MG 24 hr tablet Take 1 tablet (30 mg total) by mouth daily. 30 tablet 3  . losartan-hydrochlorothiazide (HYZAAR) 100-25 MG tablet Take 1 tablet by mouth every morning.   1  . metoprolol tartrate (LOPRESSOR)  25 MG tablet Take 1 tablet (25 mg total) by mouth 2 (two) times daily. 60 tablet 3  . nitroGLYCERIN (NITROSTAT) 0.4 MG SL tablet Place 0.4 mg under the tongue every 5 (five) minutes as needed for chest pain.    . simvastatin (ZOCOR) 20 MG tablet Take 20 mg by mouth every evening.  3   No current facility-administered medications for this visit.          Allergies  Allergen Reactions  . Penicillins Rash    Ampicillin, Has patient had a PCN reaction causing immediate rash, facial/tongue/throat swelling, SOB or lightheadedness with hypotension: Yes Has patient had a PCN reaction causing severe rash involving mucus membranes or skin necrosis: No Has patient had a PCN reaction that required hospitalization No Has patient had a PCN reaction occurring within the last 10 years: No If  all of the above answers are "NO", then may proceed with Cephalosporin use.    Review of Systems  Constitutional: Negative for activity change, appetite change, fatigue, fever and unexpected weight change.  HENT: Negative.   Eyes: Negative.   Respiratory: Negative for shortness of breath.   Cardiovascular: Positive for chest pain. Negative for palpitations and leg swelling.  Gastrointestinal: Negative.   Endocrine: Negative.   Genitourinary: Negative.   Musculoskeletal: Positive for arthralgias.  Skin: Negative.   Neurological: Negative.   Hematological: Negative.   Psychiatric/Behavioral: Negative.     BP 122/68 (BP Location: Right Arm, Patient Position: Sitting, Cuff Size: Large)   Pulse 77   Resp 20   Ht 6' 1"  (1.854 m)   Wt 292 lb (132.5 kg)   SpO2 98% Comment: RA  BMI 38.52 kg/m  Physical Exam  Constitutional: He is oriented to person, place, and time.  Obese gentleman in no distress  HENT:  Head: Normocephalic and atraumatic.  Mouth/Throat: Oropharynx is clear and moist.  Eyes: EOM are normal. Pupils are equal, round, and reactive to light.  Neck: Normal range of motion. Neck supple. No JVD present. No thyromegaly present.  Cardiovascular: Normal rate, regular rhythm, normal heart sounds and intact distal pulses.   No murmur heard. Pulmonary/Chest: Effort normal and breath sounds normal. No respiratory distress.  Abdominal: Soft. Bowel sounds are normal. There is no tenderness.  Musculoskeletal: Normal range of motion. He exhibits no edema.  Scar from right TKR.  Lymphadenopathy:    He has no cervical adenopathy.  Neurological: He is alert and oriented to person, place, and time. He has normal strength. No cranial nerve deficit or sensory deficit.  Skin: Skin is warm and dry.  Psychiatric: He has a normal mood and affect.     Diagnostic Tests:  Leonie Man, MD (Primary)    Procedures   Left Heart Cath and Coronary Angiography  Conclusion      Ost LM to LM lesion, 40 %stenosed.  Ost Cx lesion, 90 %stenosed.  Ost Ramus lesion, 90 %stenosed. Ost 1st Mrg to 1st Mrg lesion, 90 %stenosed.  Ost LAD to Prox LAD lesion, 40 %stenosed. Mid LAD lesion, 75 %stenosed.  Prox nondominant RCA lesion, 90 %stenosed.  Dist LAD lesion, 40 %stenosed.  The left ventricular systolic function is normal. LV end diastolic pressure is normal.   Patient has severe calcified ostial dominant Circumflex disease involving a Ramus Intermedius, OM branch in addition to calcified mid LAD stenosis after major diagonal branch. His relatively cloacal left main with heavy calcification. The nondominant right coronary artery is also severely diseased.  Patient's best option is  to be referred for coronary artery bypass grafting given his dominant circumflex has severe disease ostially. This is not amenable PCI as it is no landing zone for stent.  Plan: Turn to short stay holding area post catheterization care. TR band removal per protocol. We will consult CT surgery to arrange outpatient consultation to schedule CABG. Will change from nitroglycerin patch to Imdur, and add metoprolol.    Glenetta Hew, M.D., M.S. Interventional Cardiologist   Pager # [phone] Phone # [phone] [address] [place], [place] [address]    Indications   Angina, class III ([place]) [I20.9 (ICD-10-CM)]  Procedural Details/Technique   Technical Details PCP: Melinda Crutch, MD CARDIOLOGIST: Skeet Latch, MD  70 y.o. male with obesity, hypertension and hyperlipidemia who was seen by Dr. Oval Linsey in consultation on July 26 with signs symptoms concerning for progressively worsening angina now of at least class III by description. Given his cardiac risk factors of age, hypertension and hyperlipidemia and symptoms consistent with class III angina, he is referred for invasive evaluation cardiac catheterization plus minus PCI.  PROCEDURE:  Time  Out: Verified patient identification, verified procedure, site/side was marked, verified correct patient position, special equipment/implants available, medications/allergies/relevent history reviewed, required imaging and test results available. Performed. Consent Signed.   Access:  RIGHT Radial Artery: 6 Fr sheath -- Seldinger technique using Angiocath Micropuncture Kit * 10 mL radial cocktail IA; 6000 Units IV Heparin  Left Heart Catheterization: 5 Fr Catheters advanced or exchanged over a J-wire under direct fluoroscopic guidance into the ascending aorta; TIG 4.0 catheter advanced first.  Left & Right Coronary Artery Cineangiography: TIG 4.0 Catheter  LV Hemodynamics (LV Gram): Angled Pigtail Catheter  Radial Sheath(s) removed in the Cath Lab with TR Band placed for hemostasis.   TR Band: 0810 Hours; 16 mL air  MEDICATIONS * 50 g IV fentanyl, 2 mgIV Versed * SQ Lidocaine 75m * Radial Cocktail: 3 mg Verapamil * Isovue Contrast: 115 * Heparin: 6000 Units   Estimated blood loss <50 mL. . During this procedure the patient was administered the following to achieve and maintain moderate conscious sedation: Versed 2 mg, Fentanyl 50 mcg, while the patient's heart rate, blood pressure, and oxygen saturation were continuously monitored. The period of conscious sedation was 32 minutes, of which I was present face-to-face 100% of this time.    Coronary Findings   Dominance: Left  Left Main  Vessel is large. Near separate ostia The vessel is moderately calcified.  Ost LM to LM lesion, 40% stenosed. The lesion is calcified.  Left Anterior Descending  Ost LAD to Prox LAD lesion, 40% stenosed. The lesion is moderately calcified.  Mid LAD lesion, 75% stenosed. The lesion is type C and distal to major branch. The lesion is moderately calcified.  Dist LAD lesion, 40% stenosed. The lesion is tubular. Diffuse  Lateral First Diagonal Branch  Vessel is moderate in size.  First Septal Branch   Vessel is small in size.  Second Diagonal Branch  Vessel is small in size.  Second Septal Branch  Vessel is small in size.  Third Septal Branch  Vessel is small in size.  Ramus Intermedius  Vessel is moderate in size.  Ost Ramus lesion, 90% stenosed. The lesion is located at the major branch. The lesion is moderately calcified.  Left Circumflex  Ost Cx lesion, 90% stenosed. The lesion is type C, located at the major branch and focal. The lesion is severely calcified.  First Obtuse Marginal Branch  Vessel is small  in size.  Ost 1st Mrg to 1st Mrg lesion, 90% stenosed. The lesion is located at the major branch. The lesion is calcified.  Second Obtuse Marginal Branch  Vessel is moderate in size.  Lateral Third Obtuse Marginal Branch  Vessel is small in size.  Left Posterior Descending Artery  Vessel is moderate in size.  First Left Posterolateral Branch  Vessel is small in size.  Second Left Posterolateral Branch  Vessel is small in size.  Right Coronary Artery  Vessel is small.  Prox RCA lesion, 90% stenosed. The lesion is tubular and eccentric.  Wall Motion              Left Heart   Left Ventricle The left ventricular size is normal. The left ventricular systolic function is normal. LV end diastolic pressure is normal. No regional wall motion abnormalities.    Mitral Valve There is no mitral valve regurgitation.    Aortic Valve There is no aortic valve stenosis. The aortic valve is calcified. There is normal aortic valve motion.    Coronary Diagrams   Diagnostic Diagram     Implants        No implant documentation for this case.  PACS Images   Show images for Cardiac catheterization   Link to Procedure Log   Procedure Log    Hemo Data   Flowsheet Row Most Recent Value  AO Systolic Pressure 791 mmHg  AO Diastolic Pressure 62 mmHg  AO Mean 83 mmHg  LV Systolic Pressure 505 mmHg  LV Diastolic Pressure -1 mmHg  LV EDP 18 mmHg  Arterial  Occlusion Pressure Extended Systolic Pressure 697 mmHg  Arterial Occlusion Pressure Extended Diastolic Pressure 59 mmHg  Arterial Occlusion Pressure Extended Mean Pressure 85 mmHg  Left Ventricular Apex Extended Systolic Pressure 948 mmHg  Left Ventricular Apex Extended Diastolic Pressure 2 mmHg  Left Ventricular Apex Extended EDP Pressure 19 mmHg     Impression:  This gentleman has moderate left main and severe 3 vessel coronary artery disease with calcific lesions in the left main, ostial LCX and LAD. He has lifestyle-limiting exertional angina and I agree that CABG is indicated to relieve his symptoms and improve his long-term prognosis. I have reviewed the cath findings with him. I discussed the operative procedure with him including alternatives, benefits and risks; including but not limited to bleeding, blood transfusion, infection, stroke, myocardial infarction, graft failure, heart block requiring a permanent pacemaker, organ dysfunction, and death.  Roda Shutters understands and agrees to proceed.  He would like wait until 8/24 to do surgery so that he can get his classes going at [employer] with a minimum of disruption.    Plan:  CABG surgery on Thursday 06/13/2016   Gaye Pollack, MD [facility] [phone]

## 2016-06-13 ENCOUNTER — Inpatient Hospital Stay (HOSPITAL_COMMUNITY): Payer: BC Managed Care – PPO | Admitting: Certified Registered Nurse Anesthetist

## 2016-06-13 ENCOUNTER — Encounter (HOSPITAL_COMMUNITY): Payer: Self-pay | Admitting: Urology

## 2016-06-13 ENCOUNTER — Inpatient Hospital Stay (HOSPITAL_COMMUNITY)
Admission: RE | Admit: 2016-06-13 | Discharge: 2016-06-18 | DRG: 236 | Disposition: A | Discharge status: Home / Self Care | Payer: BC Managed Care – PPO | Source: Ambulatory Visit | Attending: Surgery | Admitting: Surgery

## 2016-06-13 ENCOUNTER — Encounter (HOSPITAL_COMMUNITY)
Admission: RE | Disposition: A | Discharge status: Home / Self Care | Payer: Self-pay | Source: Ambulatory Visit | Attending: Surgery

## 2016-06-13 ENCOUNTER — Inpatient Hospital Stay (HOSPITAL_COMMUNITY): Payer: BC Managed Care – PPO

## 2016-06-13 DIAGNOSIS — Z880 Allergy status to penicillin: Secondary | ICD-10-CM | POA: Diagnosis not present

## 2016-06-13 DIAGNOSIS — Z7982 Long term (current) use of aspirin: Secondary | ICD-10-CM | POA: Diagnosis not present

## 2016-06-13 DIAGNOSIS — E785 Hyperlipidemia, unspecified: Secondary | ICD-10-CM | POA: Diagnosis present

## 2016-06-13 DIAGNOSIS — E669 Obesity, unspecified: Secondary | ICD-10-CM

## 2016-06-13 DIAGNOSIS — Z6838 Body mass index (BMI) 38.0-38.9, adult: Secondary | ICD-10-CM

## 2016-06-13 DIAGNOSIS — D696 Thrombocytopenia, unspecified: Secondary | ICD-10-CM | POA: Diagnosis not present

## 2016-06-13 DIAGNOSIS — N400 Enlarged prostate without lower urinary tract symptoms: Secondary | ICD-10-CM | POA: Diagnosis present

## 2016-06-13 DIAGNOSIS — I10 Essential (primary) hypertension: Secondary | ICD-10-CM | POA: Diagnosis present

## 2016-06-13 DIAGNOSIS — Z79899 Other long term (current) drug therapy: Secondary | ICD-10-CM | POA: Diagnosis not present

## 2016-06-13 DIAGNOSIS — I25118 Atherosclerotic heart disease of native coronary artery with other forms of angina pectoris: Secondary | ICD-10-CM | POA: Diagnosis present

## 2016-06-13 DIAGNOSIS — I4891 Unspecified atrial fibrillation: Secondary | ICD-10-CM | POA: Diagnosis not present

## 2016-06-13 DIAGNOSIS — Z8249 Family history of ischemic heart disease and other diseases of the circulatory system: Secondary | ICD-10-CM | POA: Diagnosis not present

## 2016-06-13 DIAGNOSIS — I2584 Coronary atherosclerosis due to calcified coronary lesion: Secondary | ICD-10-CM | POA: Diagnosis present

## 2016-06-13 DIAGNOSIS — K5900 Constipation, unspecified: Secondary | ICD-10-CM | POA: Diagnosis not present

## 2016-06-13 DIAGNOSIS — R739 Hyperglycemia, unspecified: Secondary | ICD-10-CM | POA: Diagnosis not present

## 2016-06-13 DIAGNOSIS — I25110 Atherosclerotic heart disease of native coronary artery with unstable angina pectoris: Secondary | ICD-10-CM

## 2016-06-13 DIAGNOSIS — Z951 Presence of aortocoronary bypass graft: Secondary | ICD-10-CM

## 2016-06-13 DIAGNOSIS — I2510 Atherosclerotic heart disease of native coronary artery without angina pectoris: Secondary | ICD-10-CM | POA: Diagnosis present

## 2016-06-13 DIAGNOSIS — E8770 Fluid overload, unspecified: Secondary | ICD-10-CM | POA: Diagnosis not present

## 2016-06-13 DIAGNOSIS — D62 Acute posthemorrhagic anemia: Secondary | ICD-10-CM | POA: Diagnosis not present

## 2016-06-13 HISTORY — PX: TEE WITHOUT CARDIOVERSION: SHX5443

## 2016-06-13 HISTORY — PX: CORONARY ARTERY BYPASS GRAFT: SHX141

## 2016-06-13 LAB — POCT I-STAT, CHEM 8
BUN: 25 mg/dL — AB (ref 6–20)
BUN: 22 mg/dL — ABNORMAL HIGH (ref 6–20)
BUN: 19 mg/dL (ref 6–20)
BUN: 21 mg/dL — AB (ref 6–20)
BUN: 19 mg/dL (ref 6–20)
BUN: 20 mg/dL (ref 6–20)
BUN: 18 mg/dL (ref 6–20)
CALCIUM ION: 1.05 mmol/L — AB (ref 1.12–1.23)
CALCIUM ION: 1.12 mmol/L (ref 1.12–1.23)
CALCIUM ION: 1.15 mmol/L (ref 1.12–1.23)
CALCIUM ION: 1.18 mmol/L (ref 1.12–1.23)
CHLORIDE: 103 mmol/L (ref 101–111)
CHLORIDE: 103 mmol/L (ref 101–111)
CHLORIDE: 102 mmol/L (ref 101–111)
CREATININE: 1 mg/dL (ref 0.61–1.24)
CREATININE: 1.1 mg/dL (ref 0.61–1.24)
CREATININE: 1 mg/dL (ref 0.61–1.24)
Calcium, Ion: 1.26 mmol/L — ABNORMAL HIGH (ref 1.12–1.23)
Calcium, Ion: 1.26 mmol/L — ABNORMAL HIGH (ref 1.12–1.23)
Calcium, Ion: 1.26 mmol/L — ABNORMAL HIGH (ref 1.12–1.23)
Chloride: 100 mmol/L — ABNORMAL LOW (ref 101–111)
Chloride: 100 mmol/L — ABNORMAL LOW (ref 101–111)
Chloride: 100 mmol/L — ABNORMAL LOW (ref 101–111)
Chloride: 103 mmol/L (ref 101–111)
Creatinine, Ser: 1.4 mg/dL — ABNORMAL HIGH (ref 0.61–1.24)
Creatinine, Ser: 1.1 mg/dL (ref 0.61–1.24)
Creatinine, Ser: 1.3 mg/dL — ABNORMAL HIGH (ref 0.61–1.24)
Creatinine, Ser: 1.1 mg/dL (ref 0.61–1.24)
GLUCOSE: 119 mg/dL — AB (ref 65–99)
GLUCOSE: 118 mg/dL — AB (ref 65–99)
GLUCOSE: 129 mg/dL — AB (ref 65–99)
GLUCOSE: 145 mg/dL — AB (ref 65–99)
GLUCOSE: 140 mg/dL — AB (ref 65–99)
GLUCOSE: 157 mg/dL — AB (ref 65–99)
Glucose, Bld: 130 mg/dL — ABNORMAL HIGH (ref 65–99)
HCT: 41 % (ref 39.0–52.0)
HCT: 30 % — ABNORMAL LOW (ref 39.0–52.0)
HCT: 32 % — ABNORMAL LOW (ref 39.0–52.0)
HCT: 36 % — ABNORMAL LOW (ref 39.0–52.0)
HCT: 38 % — ABNORMAL LOW (ref 39.0–52.0)
HEMATOCRIT: 36 % — AB (ref 39.0–52.0)
HEMATOCRIT: 37 % — AB (ref 39.0–52.0)
HEMOGLOBIN: 12.2 g/dL — AB (ref 13.0–17.0)
HEMOGLOBIN: 10.2 g/dL — AB (ref 13.0–17.0)
HEMOGLOBIN: 12.6 g/dL — AB (ref 13.0–17.0)
HEMOGLOBIN: 10.9 g/dL — AB (ref 13.0–17.0)
HEMOGLOBIN: 12.9 g/dL — AB (ref 13.0–17.0)
Hemoglobin: 13.9 g/dL (ref 13.0–17.0)
Hemoglobin: 12.2 g/dL — ABNORMAL LOW (ref 13.0–17.0)
POTASSIUM: 3.6 mmol/L (ref 3.5–5.1)
POTASSIUM: 3.5 mmol/L (ref 3.5–5.1)
POTASSIUM: 3.7 mmol/L (ref 3.5–5.1)
Potassium: 3.9 mmol/L (ref 3.5–5.1)
Potassium: 4.2 mmol/L (ref 3.5–5.1)
Potassium: 3.8 mmol/L (ref 3.5–5.1)
Potassium: 4.1 mmol/L (ref 3.5–5.1)
SODIUM: 141 mmol/L (ref 135–145)
SODIUM: 140 mmol/L (ref 135–145)
SODIUM: 139 mmol/L (ref 135–145)
SODIUM: 139 mmol/L (ref 135–145)
Sodium: 142 mmol/L (ref 135–145)
Sodium: 140 mmol/L (ref 135–145)
Sodium: 137 mmol/L (ref 135–145)
TCO2: 24 mmol/L (ref 0–100)
TCO2: 28 mmol/L (ref 0–100)
TCO2: 26 mmol/L (ref 0–100)
TCO2: 26 mmol/L (ref 0–100)
TCO2: 26 mmol/L (ref 0–100)
TCO2: 24 mmol/L (ref 0–100)
TCO2: 23 mmol/L (ref 0–100)

## 2016-06-13 LAB — POCT I-STAT 3, ART BLOOD GAS (G3+)
ACID-BASE DEFICIT: 3 mmol/L — AB (ref 0.0–2.0)
ACID-BASE DEFICIT: 3 mmol/L — AB (ref 0.0–2.0)
ACID-BASE DEFICIT: 2 mmol/L (ref 0.0–2.0)
ACID-BASE EXCESS: 1 mmol/L (ref 0.0–2.0)
BICARBONATE: 26.6 meq/L — AB (ref 20.0–24.0)
BICARBONATE: 22.6 meq/L (ref 20.0–24.0)
Bicarbonate: 22.2 mEq/L (ref 20.0–24.0)
Bicarbonate: 23.1 mEq/L (ref 20.0–24.0)
O2 SAT: 100 %
O2 SAT: 96 %
O2 SAT: 98 %
O2 Saturation: 99 %
PH ART: 7.343 — AB (ref 7.350–7.450)
PH ART: 7.361 (ref 7.350–7.450)
PO2 ART: 82 mmHg (ref 80.0–100.0)
PO2 ART: 122 mmHg — AB (ref 80.0–100.0)
PO2 ART: 111 mmHg — AB (ref 80.0–100.0)
Patient temperature: 35.7
Patient temperature: 36.6
TCO2: 28 mmol/L (ref 0–100)
TCO2: 23 mmol/L (ref 0–100)
TCO2: 24 mmol/L (ref 0–100)
TCO2: 24 mmol/L (ref 0–100)
pCO2 arterial: 43.4 mmHg (ref 35.0–45.0)
pCO2 arterial: 38.1 mmHg (ref 35.0–45.0)
pCO2 arterial: 41.4 mmHg (ref 35.0–45.0)
pCO2 arterial: 40.8 mmHg (ref 35.0–45.0)
pH, Arterial: 7.395 (ref 7.350–7.450)
pH, Arterial: 7.368 (ref 7.350–7.450)
pO2, Arterial: 419 mmHg — ABNORMAL HIGH (ref 80.0–100.0)

## 2016-06-13 LAB — POCT I-STAT 4, (NA,K, GLUC, HGB,HCT)
GLUCOSE: 123 mg/dL — AB (ref 65–99)
HEMATOCRIT: 35 % — AB (ref 39.0–52.0)
Hemoglobin: 11.9 g/dL — ABNORMAL LOW (ref 13.0–17.0)
Potassium: 3.6 mmol/L (ref 3.5–5.1)
SODIUM: 141 mmol/L (ref 135–145)

## 2016-06-13 LAB — CBC
HCT: 37.2 % — ABNORMAL LOW (ref 39.0–52.0)
HCT: 36.2 % — ABNORMAL LOW (ref 39.0–52.0)
Hemoglobin: 13 g/dL (ref 13.0–17.0)
Hemoglobin: 12.6 g/dL — ABNORMAL LOW (ref 13.0–17.0)
MCH: 30.7 pg (ref 26.0–34.0)
MCH: 31 pg (ref 26.0–34.0)
MCHC: 34.9 g/dL (ref 30.0–36.0)
MCHC: 34.8 g/dL (ref 30.0–36.0)
MCV: 87.7 fL (ref 78.0–100.0)
MCV: 89.2 fL (ref 78.0–100.0)
PLATELETS: 176 10*3/uL (ref 150–400)
Platelets: 139 K/uL — ABNORMAL LOW (ref 150–400)
RBC: 4.24 MIL/uL (ref 4.22–5.81)
RBC: 4.06 MIL/uL — AB (ref 4.22–5.81)
RDW: 13.6 % (ref 11.5–15.5)
RDW: 13.6 % (ref 11.5–15.5)
WBC: 17.3 K/uL — ABNORMAL HIGH (ref 4.0–10.5)
WBC: 15.8 10*3/uL — ABNORMAL HIGH (ref 4.0–10.5)

## 2016-06-13 LAB — CREATININE, SERUM
Creatinine, Ser: 1.18 mg/dL (ref 0.61–1.24)
GFR calc non Af Amer: >60 mL/min (ref >60)

## 2016-06-13 LAB — GLUCOSE, CAPILLARY
GLUCOSE-CAPILLARY: 111 mg/dL — AB (ref 65–99)
GLUCOSE-CAPILLARY: 122 mg/dL — AB (ref 65–99)
GLUCOSE-CAPILLARY: 129 mg/dL — AB (ref 65–99)
GLUCOSE-CAPILLARY: 139 mg/dL — AB (ref 65–99)
Glucose-Capillary: 118 mg/dL — ABNORMAL HIGH (ref 65–99)

## 2016-06-13 LAB — PLATELET COUNT: PLATELETS: 140 10*3/uL — AB (ref 150–400)

## 2016-06-13 LAB — PROTIME-INR
INR: 1.42
Prothrombin Time: 17.4 s — ABNORMAL HIGH (ref 11.4–15.2)

## 2016-06-13 LAB — APTT: APTT: 36 s (ref 24–36)

## 2016-06-13 LAB — MAGNESIUM: Magnesium: 2.1 mg/dL (ref 1.7–2.4)

## 2016-06-13 LAB — HEMOGLOBIN AND HEMATOCRIT, BLOOD
HCT: 33 % — ABNORMAL LOW (ref 39.0–52.0)
Hemoglobin: 11.4 g/dL — ABNORMAL LOW (ref 13.0–17.0)

## 2016-06-13 SURGERY — CORONARY ARTERY BYPASS GRAFTING (CABG)
Anesthesia: General | Site: Chest

## 2016-06-13 MED ORDER — FENTANYL CITRATE (PF) 250 MCG/5ML IJ SOLN
INTRAMUSCULAR | Status: AC
  Filled 2016-06-13: qty 25

## 2016-06-13 MED ORDER — LACTATED RINGERS IV SOLN
INTRAVENOUS | Status: DC | PRN
  Administered 2016-06-13: 07:00:00 via INTRAVENOUS

## 2016-06-13 MED ORDER — PROPOFOL 10 MG/ML IV BOLUS
INTRAVENOUS | Status: DC | PRN
  Administered 2016-06-13: 50 mg via INTRAVENOUS
  Administered 2016-06-13: 20 mg via INTRAVENOUS

## 2016-06-13 MED ORDER — LACTATED RINGERS IV SOLN: INTRAVENOUS | Status: DC

## 2016-06-13 MED ORDER — MIDAZOLAM HCL 10 MG/2ML IJ SOLN
INTRAMUSCULAR | Status: AC
  Filled 2016-06-13: qty 2

## 2016-06-13 MED ORDER — HEMOSTATIC AGENTS (NO CHARGE) OPTIME
TOPICAL | Status: DC | PRN
  Administered 2016-06-13: 1 via TOPICAL

## 2016-06-13 MED ORDER — CHLORHEXIDINE GLUCONATE 0.12 % MT SOLN
15.0000 mL | OROMUCOSAL | Status: AC
  Administered 2016-06-13: 15 mL via OROMUCOSAL

## 2016-06-13 MED ORDER — ALBUMIN HUMAN 5 % IV SOLN
INTRAVENOUS | Status: DC | PRN
Start: 2016-06-13 — End: 2016-06-13
  Administered 2016-06-13: 13:00:00 via INTRAVENOUS

## 2016-06-13 MED ORDER — PROTAMINE SULFATE 10 MG/ML IV SOLN
INTRAVENOUS | Status: DC | PRN
  Administered 2016-06-13: 10 mg via INTRAVENOUS
  Administered 2016-06-13: 340 mg via INTRAVENOUS

## 2016-06-13 MED ORDER — ACETAMINOPHEN 160 MG/5ML PO SOLN: 1000.0000 mg | Freq: Four times a day (QID) | ORAL | Status: DC

## 2016-06-13 MED ORDER — PANTOPRAZOLE SODIUM 40 MG PO TBEC
40.0000 mg | DELAYED_RELEASE_TABLET | Freq: Every day | ORAL | Status: DC
  Administered 2016-06-15: 40 mg via ORAL
  Filled 2016-06-13: qty 1

## 2016-06-13 MED ORDER — MIDAZOLAM HCL 5 MG/5ML IJ SOLN
INTRAMUSCULAR | Status: DC | PRN
  Administered 2016-06-13 (×2): 3 mg via INTRAVENOUS
  Administered 2016-06-13 (×2): 1 mg via INTRAVENOUS
  Administered 2016-06-13: 2 mg via INTRAVENOUS

## 2016-06-13 MED ORDER — HEPARIN SODIUM (PORCINE) 1000 UNIT/ML IJ SOLN
INTRAMUSCULAR | Status: AC
  Filled 2016-06-13: qty 2

## 2016-06-13 MED ORDER — PROTAMINE SULFATE 10 MG/ML IV SOLN
INTRAVENOUS | Status: AC
  Filled 2016-06-13: qty 50

## 2016-06-13 MED ORDER — PHENYLEPHRINE HCL 10 MG/ML IJ SOLN
0.0000 ug/min | INTRAMUSCULAR | Status: DC
  Filled 2016-06-13: qty 2

## 2016-06-13 MED ORDER — ASPIRIN 81 MG PO CHEW
324.0000 mg | CHEWABLE_TABLET | Freq: Every day | ORAL | Status: DC
  Filled 2016-06-13: qty 4

## 2016-06-13 MED ORDER — FENTANYL CITRATE (PF) 250 MCG/5ML IJ SOLN
INTRAMUSCULAR | Status: AC
  Filled 2016-06-13: qty 5

## 2016-06-13 MED ORDER — FAMOTIDINE IN NACL 20-0.9 MG/50ML-% IV SOLN
20.0000 mg | Freq: Two times a day (BID) | INTRAVENOUS | Status: DC
  Administered 2016-06-13: 20 mg via INTRAVENOUS

## 2016-06-13 MED ORDER — NITROGLYCERIN IN D5W 200-5 MCG/ML-% IV SOLN: 0.0000 ug/min | INTRAVENOUS | Status: DC

## 2016-06-13 MED ORDER — ARTIFICIAL TEARS OP OINT
TOPICAL_OINTMENT | OPHTHALMIC | Status: DC | PRN
  Administered 2016-06-13: 1 via OPHTHALMIC

## 2016-06-13 MED ORDER — INSULIN REGULAR BOLUS VIA INFUSION
0.0000 [IU] | Freq: Three times a day (TID) | INTRAVENOUS | Status: DC
  Filled 2016-06-13: qty 10

## 2016-06-13 MED ORDER — ACETAMINOPHEN 160 MG/5ML PO SOLN: 650.0000 mg | Freq: Once | ORAL | Status: AC

## 2016-06-13 MED ORDER — SODIUM CHLORIDE 0.9 % IV SOLN: 250.0000 mL | INTRAVENOUS | Status: DC

## 2016-06-13 MED ORDER — SODIUM CHLORIDE 0.45 % IV SOLN
INTRAVENOUS | Status: DC | PRN
  Administered 2016-06-13: 15:00:00 via INTRAVENOUS

## 2016-06-13 MED ORDER — ALBUMIN HUMAN 5 % IV SOLN
250.0000 mL | INTRAVENOUS | Status: AC | PRN
  Administered 2016-06-13 (×3): 250 mL via INTRAVENOUS

## 2016-06-13 MED ORDER — ASPIRIN EC 325 MG PO TBEC
325.0000 mg | DELAYED_RELEASE_TABLET | Freq: Every day | ORAL | Status: DC
  Administered 2016-06-14 – 2016-06-15 (×2): 325 mg via ORAL
  Filled 2016-06-13 (×2): qty 1

## 2016-06-13 MED ORDER — POTASSIUM CHLORIDE 10 MEQ/50ML IV SOLN
10.0000 meq | INTRAVENOUS | Status: AC
  Administered 2016-06-13 (×3): 10 meq via INTRAVENOUS

## 2016-06-13 MED ORDER — LACTATED RINGERS IV SOLN
INTRAVENOUS | Status: DC
  Administered 2016-06-13: 15:00:00 via INTRAVENOUS

## 2016-06-13 MED ORDER — BISACODYL 10 MG RE SUPP
10.0000 mg | Freq: Every day | RECTAL | Status: DC
Start: 2016-06-14 — End: 2016-06-15
  Filled 2016-06-13: qty 1

## 2016-06-13 MED ORDER — METOPROLOL TARTRATE 12.5 MG HALF TABLET
12.5000 mg | ORAL_TABLET | Freq: Two times a day (BID) | ORAL | Status: DC
  Administered 2016-06-14 – 2016-06-15 (×3): 12.5 mg via ORAL
  Filled 2016-06-13 (×3): qty 1

## 2016-06-13 MED ORDER — LIDOCAINE HCL (CARDIAC) 20 MG/ML IV SOLN: INTRAVENOUS | Status: DC | PRN

## 2016-06-13 MED ORDER — MORPHINE SULFATE (PF) 2 MG/ML IV SOLN: 1.0000 mg | INTRAVENOUS | Status: AC | PRN

## 2016-06-13 MED ORDER — SODIUM CHLORIDE 0.9% FLUSH: 3.0000 mL | INTRAVENOUS | Status: DC | PRN

## 2016-06-13 MED ORDER — FENTANYL CITRATE (PF) 250 MCG/5ML IJ SOLN
INTRAMUSCULAR | Status: DC | PRN
  Administered 2016-06-13: 250 ug via INTRAVENOUS
  Administered 2016-06-13: 50 ug via INTRAVENOUS
  Administered 2016-06-13: 250 ug via INTRAVENOUS
  Administered 2016-06-13: 100 ug via INTRAVENOUS
  Administered 2016-06-13: 650 ug via INTRAVENOUS
  Administered 2016-06-13: 50 ug via INTRAVENOUS
  Administered 2016-06-13: 250 ug via INTRAVENOUS

## 2016-06-13 MED ORDER — LACTATED RINGERS IV SOLN: 500.0000 mL | Freq: Once | INTRAVENOUS | Status: DC | PRN

## 2016-06-13 MED ORDER — SODIUM CHLORIDE 0.9% FLUSH
3.0000 mL | Freq: Two times a day (BID) | INTRAVENOUS | Status: DC
  Administered 2016-06-14 – 2016-06-15 (×3): 3 mL via INTRAVENOUS

## 2016-06-13 MED ORDER — SIMVASTATIN 20 MG PO TABS
20.0000 mg | ORAL_TABLET | Freq: Every evening | ORAL | Status: DC
  Administered 2016-06-14 – 2016-06-17 (×4): 20 mg via ORAL
  Filled 2016-06-13 (×4): qty 1

## 2016-06-13 MED ORDER — MIDAZOLAM HCL 2 MG/2ML IJ SOLN: 2.0000 mg | INTRAMUSCULAR | Status: DC | PRN

## 2016-06-13 MED ORDER — THROMBIN 20000 UNITS EX SOLR
CUTANEOUS | Status: AC
  Filled 2016-06-13: qty 20000

## 2016-06-13 MED ORDER — METOPROLOL TARTRATE 5 MG/5ML IV SOLN
2.5000 mg | INTRAVENOUS | Status: DC | PRN
  Filled 2016-06-13: qty 5

## 2016-06-13 MED ORDER — SODIUM CHLORIDE 0.9 % IV SOLN
INTRAVENOUS | Status: DC
  Administered 2016-06-13: 15:00:00 via INTRAVENOUS

## 2016-06-13 MED ORDER — ONDANSETRON HCL 4 MG/2ML IJ SOLN
4.0000 mg | Freq: Four times a day (QID) | INTRAMUSCULAR | Status: DC | PRN
  Administered 2016-06-13: 4 mg via INTRAVENOUS
  Filled 2016-06-13: qty 2

## 2016-06-13 MED ORDER — METOPROLOL TARTRATE 25 MG/10 ML ORAL SUSPENSION
12.5000 mg | Freq: Two times a day (BID) | ORAL | Status: DC
  Filled 2016-06-13: qty 5

## 2016-06-13 MED ORDER — OXYCODONE HCL 5 MG PO TABS
5.0000 mg | ORAL_TABLET | ORAL | Status: DC | PRN
  Administered 2016-06-14: 10 mg via ORAL
  Filled 2016-06-13 (×2): qty 2
  Filled 2016-06-13: qty 1

## 2016-06-13 MED ORDER — VANCOMYCIN HCL IN DEXTROSE 1-5 GM/200ML-% IV SOLN
1000.0000 mg | Freq: Once | INTRAVENOUS | Status: AC
  Administered 2016-06-13: 1000 mg via INTRAVENOUS
  Filled 2016-06-13: qty 200

## 2016-06-13 MED ORDER — METOCLOPRAMIDE HCL 5 MG/ML IJ SOLN
10.0000 mg | Freq: Four times a day (QID) | INTRAMUSCULAR | Status: DC
  Administered 2016-06-13 – 2016-06-15 (×7): 10 mg via INTRAVENOUS
  Filled 2016-06-13 (×5): qty 2

## 2016-06-13 MED ORDER — SODIUM CHLORIDE 0.9 % IR SOLN
Status: DC | PRN
  Administered 2016-06-13: 1

## 2016-06-13 MED ORDER — PROPOFOL 10 MG/ML IV BOLUS
INTRAVENOUS | Status: AC
  Filled 2016-06-13: qty 20

## 2016-06-13 MED ORDER — TRAMADOL HCL 50 MG PO TABS: 50.0000 mg | ORAL_TABLET | ORAL | Status: DC | PRN

## 2016-06-13 MED ORDER — POTASSIUM CHLORIDE 10 MEQ/50ML IV SOLN
10.0000 meq | Freq: Once | INTRAVENOUS | Status: AC
  Administered 2016-06-13: 10 meq via INTRAVENOUS

## 2016-06-13 MED ORDER — ROCURONIUM BROMIDE 100 MG/10ML IV SOLN
INTRAVENOUS | Status: DC | PRN
  Administered 2016-06-13 (×2): 50 mg via INTRAVENOUS

## 2016-06-13 MED ORDER — ACETAMINOPHEN 650 MG RE SUPP
650.0000 mg | Freq: Once | RECTAL | Status: AC
  Administered 2016-06-13: 650 mg via RECTAL

## 2016-06-13 MED ORDER — VECURONIUM BROMIDE 10 MG IV SOLR
INTRAVENOUS | Status: DC | PRN
Start: 2016-06-13 — End: 2016-06-13
  Administered 2016-06-13 (×3): 5 mg via INTRAVENOUS

## 2016-06-13 MED ORDER — MAGNESIUM SULFATE 4 GM/100ML IV SOLN
4.0000 g | Freq: Once | INTRAVENOUS | Status: AC
  Administered 2016-06-13: 4 g via INTRAVENOUS
  Filled 2016-06-13: qty 100

## 2016-06-13 MED ORDER — HEPARIN SODIUM (PORCINE) 1000 UNIT/ML IJ SOLN
INTRAMUSCULAR | Status: DC | PRN
  Administered 2016-06-13: 45000 [IU] via INTRAVENOUS

## 2016-06-13 MED ORDER — DEXMEDETOMIDINE HCL IN NACL 200 MCG/50ML IV SOLN
0.0000 ug/kg/h | INTRAVENOUS | Status: DC
  Administered 2016-06-13: 0.5 ug/kg/h via INTRAVENOUS
  Filled 2016-06-13: qty 50

## 2016-06-13 MED ORDER — LACTATED RINGERS IV SOLN
INTRAVENOUS | Status: DC | PRN
Start: 2016-06-13 — End: 2016-06-13
  Administered 2016-06-13 (×2): via INTRAVENOUS

## 2016-06-13 MED ORDER — VECURONIUM BROMIDE 10 MG IV SOLR
INTRAVENOUS | Status: AC
  Filled 2016-06-13: qty 10

## 2016-06-13 MED ORDER — MORPHINE SULFATE (PF) 2 MG/ML IV SOLN
2.0000 mg | INTRAVENOUS | Status: DC | PRN
  Filled 2016-06-13: qty 1

## 2016-06-13 MED ORDER — SODIUM CHLORIDE 0.9 % IV SOLN
INTRAVENOUS | Status: DC
  Filled 2016-06-13 (×3): qty 2.5

## 2016-06-13 MED ORDER — EPHEDRINE SULFATE 50 MG/ML IJ SOLN
INTRAMUSCULAR | Status: DC | PRN
  Administered 2016-06-13 (×2): 10 mg via INTRAVENOUS

## 2016-06-13 MED ORDER — ACETAMINOPHEN 500 MG PO TABS
1000.0000 mg | ORAL_TABLET | Freq: Four times a day (QID) | ORAL | Status: DC
  Administered 2016-06-14 – 2016-06-15 (×6): 1000 mg via ORAL
  Filled 2016-06-13 (×4): qty 2

## 2016-06-13 MED ORDER — BISACODYL 5 MG PO TBEC
10.0000 mg | DELAYED_RELEASE_TABLET | Freq: Every day | ORAL | Status: DC
  Administered 2016-06-14 – 2016-06-15 (×2): 10 mg via ORAL
  Filled 2016-06-13 (×2): qty 2

## 2016-06-13 MED ORDER — LEVOFLOXACIN IN D5W 750 MG/150ML IV SOLN
750.0000 mg | INTRAVENOUS | Status: AC
  Administered 2016-06-14: 750 mg via INTRAVENOUS
  Filled 2016-06-13: qty 150

## 2016-06-13 MED ORDER — CETYLPYRIDINIUM CHLORIDE 0.05 % MT LIQD
7.0000 mL | Freq: Two times a day (BID) | OROMUCOSAL | Status: DC
  Administered 2016-06-13 – 2016-06-14 (×2): 7 mL via OROMUCOSAL

## 2016-06-13 MED ORDER — DOCUSATE SODIUM 100 MG PO CAPS
200.0000 mg | ORAL_CAPSULE | Freq: Every day | ORAL | Status: DC
  Administered 2016-06-14 – 2016-06-15 (×2): 200 mg via ORAL
  Filled 2016-06-13 (×2): qty 2

## 2016-06-13 MED FILL — Potassium Chloride Inj 2 mEq/ML: INTRAVENOUS | Qty: 40 | Status: AC

## 2016-06-13 MED FILL — Magnesium Sulfate Inj 50%: INTRAMUSCULAR | Qty: 10 | Status: AC

## 2016-06-13 MED FILL — Heparin Sodium (Porcine) Inj 1000 Unit/ML: INTRAMUSCULAR | Qty: 30 | Status: AC

## 2016-06-13 SURGICAL SUPPLY — 109 items
ADH SKN CLS APL DERMABOND .7 (GAUZE/BANDAGES/DRESSINGS) ×2
APL SKNCLS STERI-STRIP NONHPOA (GAUZE/BANDAGES/DRESSINGS) ×2
BAG DECANTER FOR FLEXI CONT (MISCELLANEOUS) ×4 IMPLANT
BANDAGE ELASTIC 4 VELCRO ST LF (GAUZE/BANDAGES/DRESSINGS) ×4 IMPLANT
BANDAGE ELASTIC 6 VELCRO ST LF (GAUZE/BANDAGES/DRESSINGS) ×4 IMPLANT
BASKET HEART  (ORDER IN 25'S) (MISCELLANEOUS) ×1
BASKET HEART (ORDER IN 25'S) (MISCELLANEOUS) ×1
BASKET HEART (ORDER IN 25S) (MISCELLANEOUS) ×2 IMPLANT
BENZOIN TINCTURE PRP APPL 2/3 (GAUZE/BANDAGES/DRESSINGS) ×2 IMPLANT
BLADE STERNUM SYSTEM 6 (BLADE) ×4 IMPLANT
BLADE SURG 11 STRL SS (BLADE) ×2 IMPLANT
BNDG GAUZE ELAST 4 BULKY (GAUZE/BANDAGES/DRESSINGS) ×4 IMPLANT
CANISTER SUCTION 2500CC (MISCELLANEOUS) ×4 IMPLANT
CANNULA ARTERIAL NVNT 3/8 22FR (MISCELLANEOUS) ×2 IMPLANT
CATH ROBINSON RED A/P 18FR (CATHETERS) ×8 IMPLANT
CATH THORACIC 28FR (CATHETERS) ×4 IMPLANT
CATH THORACIC 36FR (CATHETERS) ×4 IMPLANT
CATH THORACIC 36FR RT ANG (CATHETERS) ×4 IMPLANT
CLIP TI MEDIUM 24 (CLIP) IMPLANT
CLIP TI WIDE RED SMALL 24 (CLIP) ×4 IMPLANT
CRADLE DONUT ADULT HEAD (MISCELLANEOUS) ×4 IMPLANT
DERMABOND ADVANCED (GAUZE/BANDAGES/DRESSINGS) ×2
DERMABOND ADVANCED .7 DNX12 (GAUZE/BANDAGES/DRESSINGS) IMPLANT
DRAPE CARDIOVASCULAR INCISE (DRAPES) ×4
DRAPE INCISE IOBAN 66X45 STRL (DRAPES) ×2 IMPLANT
DRAPE SLUSH/WARMER DISC (DRAPES) ×4 IMPLANT
DRAPE SRG 135X102X78XABS (DRAPES) ×2 IMPLANT
DRSG COVADERM 4X14 (GAUZE/BANDAGES/DRESSINGS) ×4 IMPLANT
ELECT CAUTERY BLADE 6.4 (BLADE) ×4 IMPLANT
ELECT REM PT RETURN 9FT ADLT (ELECTROSURGICAL) ×8
ELECTRODE REM PT RTRN 9FT ADLT (ELECTROSURGICAL) ×4 IMPLANT
FELT TEFLON 1X6 (MISCELLANEOUS) ×8 IMPLANT
GAUZE SPONGE 4X4 12PLY STRL (GAUZE/BANDAGES/DRESSINGS) ×8 IMPLANT
GLOVE BIO SURGEON STRL SZ 6 (GLOVE) IMPLANT
GLOVE BIO SURGEON STRL SZ 6.5 (GLOVE) ×6 IMPLANT
GLOVE BIO SURGEON STRL SZ7 (GLOVE) IMPLANT
GLOVE BIO SURGEON STRL SZ7.5 (GLOVE) IMPLANT
GLOVE BIO SURGEONS STRL SZ 6.5 (GLOVE) ×6
GLOVE BIOGEL PI IND STRL 6 (GLOVE) IMPLANT
GLOVE BIOGEL PI IND STRL 6.5 (GLOVE) IMPLANT
GLOVE BIOGEL PI IND STRL 7.0 (GLOVE) IMPLANT
GLOVE BIOGEL PI INDICATOR 6 (GLOVE) ×6
GLOVE BIOGEL PI INDICATOR 6.5 (GLOVE)
GLOVE BIOGEL PI INDICATOR 7.0 (GLOVE)
GLOVE EUDERMIC 7 POWDERFREE (GLOVE) ×10 IMPLANT
GLOVE ORTHO TXT STRL SZ7.5 (GLOVE) IMPLANT
GLOVE SURG SS PI 6.0 STRL IVOR (GLOVE) ×4 IMPLANT
GOWN STRL REUS W/ TWL LRG LVL3 (GOWN DISPOSABLE) ×8 IMPLANT
GOWN STRL REUS W/ TWL XL LVL3 (GOWN DISPOSABLE) ×2 IMPLANT
GOWN STRL REUS W/TWL LRG LVL3 (GOWN DISPOSABLE) ×24
GOWN STRL REUS W/TWL XL LVL3 (GOWN DISPOSABLE) ×8
HEMOSTAT POWDER SURGIFOAM 1G (HEMOSTASIS) ×12 IMPLANT
HEMOSTAT SURGICEL 2X14 (HEMOSTASIS) ×4 IMPLANT
INSERT FOGARTY 61MM (MISCELLANEOUS) IMPLANT
INSERT FOGARTY XLG (MISCELLANEOUS) IMPLANT
KIT BASIN OR (CUSTOM PROCEDURE TRAY) ×4 IMPLANT
KIT CATH CPB BARTLE (MISCELLANEOUS) ×4 IMPLANT
KIT ROOM TURNOVER OR (KITS) ×4 IMPLANT
KIT SUCTION CATH 14FR (SUCTIONS) ×4 IMPLANT
KIT VASOVIEW 6 PRO VH 2400 (KITS) ×2 IMPLANT
KIT VASOVIEW HEMOPRO VH 3000 (KITS) ×2 IMPLANT
NS IRRIG 1000ML POUR BTL (IV SOLUTION) ×20 IMPLANT
PACK OPEN HEART (CUSTOM PROCEDURE TRAY) ×4 IMPLANT
PAD ARMBOARD 7.5X6 YLW CONV (MISCELLANEOUS) ×8 IMPLANT
PAD ELECT DEFIB RADIOL ZOLL (MISCELLANEOUS) ×4 IMPLANT
PENCIL BUTTON HOLSTER BLD 10FT (ELECTRODE) ×4 IMPLANT
PUNCH AORTIC ROTATE 4.0MM (MISCELLANEOUS) IMPLANT
PUNCH AORTIC ROTATE 4.5MM 8IN (MISCELLANEOUS) ×4 IMPLANT
PUNCH AORTIC ROTATE 5MM 8IN (MISCELLANEOUS) IMPLANT
SET CARDIOPLEGIA MPS 5001102 (MISCELLANEOUS) ×2 IMPLANT
SPONGE GAUZE 4X4 12PLY STER LF (GAUZE/BANDAGES/DRESSINGS) ×4 IMPLANT
SPONGE INTESTINAL PEANUT (DISPOSABLE) IMPLANT
SPONGE LAP 18X18 X RAY DECT (DISPOSABLE) ×2 IMPLANT
SPONGE LAP 4X18 X RAY DECT (DISPOSABLE) ×4 IMPLANT
SUT BONE WAX W31G (SUTURE) ×4 IMPLANT
SUT MNCRL AB 4-0 PS2 18 (SUTURE) ×2 IMPLANT
SUT PROLENE 3 0 SH DA (SUTURE) IMPLANT
SUT PROLENE 3 0 SH1 36 (SUTURE) ×4 IMPLANT
SUT PROLENE 4 0 RB 1 (SUTURE)
SUT PROLENE 4 0 SH DA (SUTURE) IMPLANT
SUT PROLENE 4-0 RB1 .5 CRCL 36 (SUTURE) IMPLANT
SUT PROLENE 5 0 C 1 36 (SUTURE) IMPLANT
SUT PROLENE 6 0 C 1 30 (SUTURE) ×2 IMPLANT
SUT PROLENE 7 0 BV 1 (SUTURE) IMPLANT
SUT PROLENE 7 0 BV1 MDA (SUTURE) ×6 IMPLANT
SUT PROLENE 8 0 BV175 6 (SUTURE) IMPLANT
SUT SILK  1 MH (SUTURE)
SUT SILK 1 MH (SUTURE) IMPLANT
SUT SILK 2 0 SH CR/8 (SUTURE) ×2 IMPLANT
SUT STEEL STERNAL CCS#1 18IN (SUTURE) IMPLANT
SUT STEEL SZ 6 DBL 3X14 BALL (SUTURE) ×6 IMPLANT
SUT VIC AB 1 CTX 36 (SUTURE) ×8
SUT VIC AB 1 CTX36XBRD ANBCTR (SUTURE) ×4 IMPLANT
SUT VIC AB 2-0 CT1 27 (SUTURE) ×4
SUT VIC AB 2-0 CT1 TAPERPNT 27 (SUTURE) IMPLANT
SUT VIC AB 2-0 CTX 27 (SUTURE) IMPLANT
SUT VIC AB 3-0 SH 27 (SUTURE)
SUT VIC AB 3-0 SH 27X BRD (SUTURE) IMPLANT
SUT VIC AB 3-0 X1 27 (SUTURE) IMPLANT
SUT VICRYL 4-0 PS2 18IN ABS (SUTURE) IMPLANT
SUTURE E-PAK OPEN HEART (SUTURE) ×4 IMPLANT
SYSTEM SAHARA CHEST DRAIN ATS (WOUND CARE) ×4 IMPLANT
TAPE CLOTH SURG 4X10 WHT LF (GAUZE/BANDAGES/DRESSINGS) ×4 IMPLANT
TOWEL OR 17X24 6PK STRL BLUE (TOWEL DISPOSABLE) ×4 IMPLANT
TOWEL OR 17X26 10 PK STRL BLUE (TOWEL DISPOSABLE) ×4 IMPLANT
TRAY FOLEY IC TEMP SENS 16FR (CATHETERS) ×4 IMPLANT
TUBING INSUFFLATION (TUBING) ×4 IMPLANT
UNDERPAD 30X30 INCONTINENT (UNDERPADS AND DIAPERS) ×4 IMPLANT
WATER STERILE IRR 1000ML POUR (IV SOLUTION) ×8 IMPLANT

## 2016-06-13 NOTE — Anesthesia Procedure Notes (Signed)
Procedure Name: Intubation Date/Time: 06/13/2016 7:50 AM Performed by: Tillman AbideHAWKINS, Gustav Knueppel B Pre-anesthesia Checklist: Patient identified, Emergency Drugs available, Suction available and Patient being monitored Patient Re-evaluated:Patient Re-evaluated prior to inductionOxygen Delivery Method: Circle System Utilized Preoxygenation: Pre-oxygenation with 100% oxygen Intubation Type: IV induction Ventilation: Mask ventilation without difficulty and Two handed mask ventilation required Laryngoscope Size: Glidescope Grade View: Grade II Tube type: Oral Tube size: 8.0 mm Number of attempts: 1 Airway Equipment and Method: Stylet and Oral airway Placement Confirmation: ETT inserted through vocal cords under direct vision,  positive ETCO2 and breath sounds checked- equal and bilateral Secured at: 24 cm Tube secured with: Tape Dental Injury: Teeth and Oropharynx as per pre-operative assessment

## 2016-06-13 NOTE — OR Nursing (Signed)
1329 SICU Charge nurse called. Second call to N. Zafiris, Charity fundraiserN.

## 2016-06-13 NOTE — Anesthesia Preprocedure Evaluation (Addendum)
Anesthesia Evaluation  Patient identified by MRN, date of birth, ID band Patient awake    Reviewed: Allergy & Precautions, NPO status , Patient's Chart, lab work & pertinent test results, reviewed documented beta blocker date and time   History of Anesthesia Complications Negative for: history of anesthetic complications  Airway Mallampati: IV  TM Distance: >3 FB Neck ROM: Full    Dental  (+) Dental Advisory Given   Pulmonary neg pulmonary ROS,    breath sounds clear to auscultation       Cardiovascular hypertension, Pt. on home beta blockers and Pt. on medications + angina with exertion + CAD   Rhythm:Regular Rate:Normal  Preserved LVF   Neuro/Psych negative neurological ROS     GI/Hepatic negative GI ROS, Neg liver ROS,   Endo/Other  Morbid obesity  Renal/GU Renal disease (creat 1.39)     Musculoskeletal  (+) Arthritis , Osteoarthritis,    Abdominal   Peds  Hematology negative hematology ROS (+)   Anesthesia Other Findings   Reproductive/Obstetrics                            Anesthesia Physical Anesthesia Plan  ASA: III  Anesthesia Plan: General   Post-op Pain Management:    Induction: Intravenous  Airway Management Planned: Oral ETT  Additional Equipment: PA Cath, Ultrasound Guidance Line Placement, TEE and Arterial line  Intra-op Plan:   Post-operative Plan: Post-operative intubation/ventilation  Informed Consent: I have reviewed the patients History and Physical, chart, labs and discussed the procedure including the risks, benefits and alternatives for the proposed anesthesia with the patient or authorized representative who has indicated his/her understanding and acceptance.   Dental advisory given  Plan Discussed with: CRNA and Surgeon  Anesthesia Plan Comments: (Plan routine monitors, A line, PA cath, GETA with TEE and post op ventilation)        Anesthesia  Quick Evaluation

## 2016-06-13 NOTE — Anesthesia Procedure Notes (Signed)
Central Venous Catheter Insertion Performed by: anesthesiologist 06/13/2016 6:45 AM Patient location: Pre-op. Preanesthetic checklist: patient identified, IV checked, site marked, risks and benefits discussed, surgical consent, monitors and equipment checked, pre-op evaluation, timeout performed and anesthesia consent Lidocaine 1% used for infiltration Landmarks identified Catheter size: 8.5 Fr Central line was placed.Sheath introducer Procedure performed using ultrasound guided technique. Attempts: 1 Following insertion, line sutured and dressing applied. Post procedure assessment: blood return through all ports, free fluid flow and no air. Patient tolerated the procedure well with no immediate complications.

## 2016-06-13 NOTE — OR Nursing (Signed)
1259 SICU Charge nurse called. First call made to N. Zafiris, Charity fundraiserN.

## 2016-06-13 NOTE — OR Nursing (Signed)
Rolling call made to SICU. 

## 2016-06-13 NOTE — Op Note (Signed)
CARDIOVASCULAR SURGERY OPERATIVE NOTE  06/13/2016  Surgeon:  Alleen Borne, MD  First Assistant: Jari Favre,  PA-C   Preoperative Diagnosis:  Severe multi-vessel coronary artery disease   Postoperative Diagnosis:  Same   Procedure:  1. Median Sternotomy 2. Extracorporeal circulation 3.   Coronary artery bypass grafting x 4   Left internal mammary graft to the LAD  SVG to Ramus  Sequential SVG to OM and distal LCX  4.   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 He first noted it while walking across campus and he has continued to have 2-3/10 substernal chest tightness with walking long distances or up an incline. He was in [place] last month and had recurrent episodes while walking around the city. He saw his PCP after that and was referred to cardiology. Cath on 05/21/2016 showed a 90% calcified ostial dominant LCX stenosis as well as a 75% mid LAD stenosis after the major diagonal. The LM has a long, calcified 40% ostial to mid vessel stenosis. The RCA is small and non-dominant with 90% proximal stenosis. LVEF is normal. He has had no symptoms at rest or with doing activities around his house. He has no associated shortness of breath. This gentleman has moderate left main and severe 3 vessel coronary artery disease with calcific lesions in the left main, ostial LCX and LAD. He has lifestyle-limiting exertional angina and I agree that CABG is indicated to relieve his symptoms and improve his long-term prognosis. I have reviewed the cath findings with him. I discussed the operative procedure with him  including alternatives, benefits and risks; including but not limited to bleeding, blood transfusion, infection, stroke, myocardial infarction, graft failure, heart block requiring a permanent pacemaker, organ dysfunction, and death. Ryott Donaldsonunderstands and agrees to proceed.   Preparation:  The patient was seen in the preoperative holding area and the correct patient, correct operation were confirmed with the patient after reviewing the medical record and catheterization. The consent was signed by me. Preoperative antibiotics were given. A pulmonary arterial line and radial arterial line were placed by the anesthesia team. The patient was taken back to the operating room and positioned supine on the operating room table. After being placed under general endotracheal anesthesia by the anesthesia team a foley catheter was placed. The neck, chest, abdomen, and both legs were prepped with betadine soap and solution and draped in the usual sterile manner. A surgical time-out was taken and the correct patient and operative procedure were confirmed with the nursing and anesthesia staff.   Cardiopulmonary Bypass:  A median sternotomy was performed. The pericardium was opened in the midline. Right ventricular function appeared normal. The ascending aorta was of normal size and had no palpable plaque. There were no contraindications to aortic cannulation or cross-clamping. The patient was fully systemically heparinized and the ACT was maintained > 400 sec. The proximal aortic arch was cannulated with a 22 F aortic cannula for arterial inflow. Venous cannulation was performed via the right atrial appendage using a two-staged venous cannula. An antegrade cardioplegia/vent cannula was inserted into the mid-ascending aorta. Aortic occlusion was performed with a single cross-clamp. Systemic cooling to 32 degrees Centigrade and topical cooling of the heart with iced saline were  used. Hyperkalemic antegrade  cold blood cardioplegia was used to induce diastolic arrest and was then given at about 20 minute intervals throughout the period of arrest to maintain myocardial temperature at or below 10 degrees centigrade. A temperature probe was inserted into the interventricular septum and an insulating pad was placed in the pericardium.   Left internal mammary harvest:  The left side of the sternum was retracted using the Rultract retractor. The left internal mammary artery was harvested as a pedicle graft. All side branches were clipped. It was a medium-sized vessel of good quality with excellent blood flow. It was ligated distally and divided. It was sprayed with topical papaverine solution to prevent vasospasm.   Endoscopic vein harvest:  The right greater saphenous vein was harvested endoscopically through a 2 cm incision medial to the right knee. It was harvested from the upper thigh to below the knee. It was a medium-sized vein of good quality. The side branches were all ligated with 4-0 silk ties.    Coronary arteries:  The coronary arteries were examined.   LAD:  Large vessel with mild distal disease and thickened walls  LCX:  Moderate sized Ramus branch that is heavily diseased proximally. The OM1 and distal LCX are large branches with mild distal disease.  RCA:  Small, non-dominant and not visible on the surface.   Grafts:  1. LIMA to the LAD: 2.5 mm. It was sewn end to side using 8-0 prolene continuous suture. 2. SVG to Ramus:  1.75 mm. It was sewn end to side using 7-0 prolene continuous suture. 3. Sequential SVG to OM1:  1.75 mm. It was sewn sequential side to side using 7-0 prolene continuous suture. 4. Sequential SVG to distal LCX:  1.75 mm. It was sewn sequential end to side using 7-0 prolene continuous suture.  The proximal vein graft anastomoses were performed to the mid-ascending aorta using continuous 6-0 prolene suture. Graft markers were placed around the proximal  anastomoses.   Completion:  The patient was rewarmed to 37 degrees Centigrade. The clamp was removed from the LIMA pedicle and there was rapid warming of the septum and return of ventricular fibrillation. The crossclamp was removed with a time of 76 minutes. There was spontaneous return of sinus rhythm. The distal and proximal anastomoses were checked for hemostasis. The position of the grafts was satisfactory. Two temporary epicardial pacing wires were placed on the right atrium and two on the right ventricle. The patient was weaned from CPB without difficulty on no inotropes. CPB time was 97 minutes. Cardiac output was 6 LPM. Heparin was fully reversed with protamine and the aortic and venous cannulas removed. Hemostasis was achieved. Mediastinal and left pleural drainage tubes were placed. The sternum was closed with double #6 stainless steel wires. The fascia was closed with continuous # 1 vicryl suture. The subcutaneous tissue was closed with 2-0 vicryl continuous suture. The skin was closed with 3-0 vicryl subcuticular suture. All sponge, needle, and instrument counts were reported correct at the end of the case. Dry sterile dressings were placed over the incisions and around the chest tubes which were connected to pleurevac suction. The patient was then transported to the surgical intensive care unit in critical but stable condition.

## 2016-06-13 NOTE — Procedures (Signed)
Extubation Procedure Note  Patient Details:   Name: Scott Johnson DOB: 01/10/1946 MRN: [id number]   Airway Documentation: Patient had NIF -40 cmh20, VC 1.1 L/min abl to follow all commands and audible cuff leak.  Extubated to 4 lpm [place] sat 99%. Pt able to speak and state location with strong cough.  IS instruction given able to do 1000cc.    Evaluation  O2 sats: stable throughout Complications: No apparent complications Patient did tolerate procedure well. Bilateral Breath Sounds: Clear   Yes  Richmond CampbellHall, Jadien Lehigh Lynn 06/13/2016, 6:34 PM

## 2016-06-13 NOTE — Interval H&P Note (Signed)
History and Physical Interval Note:  06/13/2016 6:06 AM  Scott Johnson  has presented today for surgery, with the diagnosis of CAD  The various methods of treatment have been discussed with the patient and family. After consideration of risks, benefits and other options for treatment, the patient has consented to  Procedure(s): CORONARY ARTERY BYPASS GRAFTING (CABG) (N/A) TRANSESOPHAGEAL ECHOCARDIOGRAM (TEE) (N/A) as a surgical intervention .  The patient's history has been reviewed, patient examined, no change in status, stable for surgery.  I have reviewed the patient's chart and labs.  Questions were answered to the patient's satisfaction.     Scott Johnson

## 2016-06-13 NOTE — Brief Op Note (Signed)
06/13/2016  1:12 PM  PATIENT:  Scott Johnson  70 y.o. male  PRE-OPERATIVE DIAGNOSIS:  CAD  POST-OPERATIVE DIAGNOSIS:  CAD  PROCEDURE:  Procedure(s): CORONARY ARTERY BYPASS GRAFTING (CABG)x 4 with endoscopic harvesting of right greater saphenous vein (N/A) TRANSESOPHAGEAL ECHOCARDIOGRAM (TEE) (N/A)  SURGEON:  Surgeon(s) and Role:    * Alleen BorneBryan K Bartle, MD - Primary  PHYSICIAN ASSISTANT:   Jari Favreessa Charonda Hefter, PA-C   ANESTHESIA:   general  EBL:  Total I/O In: -  Out: 1140 [Urine:1140]  BLOOD ADMINISTERED:none  DRAINS: routine   LOCAL MEDICATIONS USED:  NONE  SPECIMEN:  No Specimen  DISPOSITION OF SPECIMEN:  N/A  COUNTS:  YES  TOURNIQUET:  * No tourniquets in log *  DICTATION: .Other Dictation: Dictation Number pending  PLAN OF CARE: Admit to inpatient   PATIENT DISPOSITION:  ICU - intubated and hemodynamically stable.

## 2016-06-13 NOTE — Progress Notes (Signed)
CT surgery p.m. Rounds  Patient doing well after multivessel CABG Extubated with stable pulmonary function Hemodynamics stable with cardiac Index 2.1    With atrial pacing Postop laboratory values are satisfactory

## 2016-06-13 NOTE — Transfer of Care (Signed)
Immediate Anesthesia Transfer of Care Note  Patient: Scott Johnson  Procedure(s) Performed: Procedure(s): CORONARY ARTERY BYPASS GRAFTING (CABG)x 4 with endoscopic harvesting of right greater saphenous vein (N/A) TRANSESOPHAGEAL ECHOCARDIOGRAM (TEE) (N/A)  Patient Location: ICU  Anesthesia Type:General  Level of Consciousness: Patient remains intubated per anesthesia plan  Airway & Oxygen Therapy: Patient placed on Ventilator (see vital sign flow sheet for setting)  Post-op Assessment: Report given to RN and Post -op Vital signs reviewed and stable  Post vital signs: Reviewed and stable  Last Vitals:  Vitals:   06/13/16 1424 06/13/16 1429  BP:  104/67  Pulse: 80 81  Resp: 12 15  Temp: (!) 35.8 C (!) 35.8 C    Last Pain:  Vitals:         Complications: No apparent anesthesia complications   Report to RN at bedside. Tolerated transport well.  100% 02 with bag mask 10 bpm with full monitors for transport.  Dr. Laneta SimmersBartle at bedside.

## 2016-06-13 NOTE — Anesthesia Postprocedure Evaluation (Signed)
Anesthesia Post Note  Patient: Soyla MurphyFranklin Luoma  Procedure(s) Performed: Procedure(s) (LRB): CORONARY ARTERY BYPASS GRAFTING (CABG)x 4 with endoscopic harvesting of right greater saphenous vein (N/A) TRANSESOPHAGEAL ECHOCARDIOGRAM (TEE) (N/A)  Patient location during evaluation: SICU Anesthesia Type: General Level of consciousness: sedated, patient cooperative and oriented Pain management: pain level controlled Vital Signs Assessment: post-procedure vital signs reviewed and stable Respiratory status: spontaneous breathing, nonlabored ventilation, respiratory function stable and patient connected to nasal cannula oxygen (pt is now exubated, doing well) Cardiovascular status: blood pressure returned to baseline and stable Postop Assessment: no signs of nausea or vomiting Anesthetic complications: no    Last Vitals:  Vitals:   06/13/16 1915 06/13/16 1930  BP:    Pulse: 80 80  Resp: (!) 23 (!) 23  Temp: 36.8 C 36.8 C    Last Pain:  Vitals:   06/13/16 1930  TempSrc: Core (Comment)  PainSc:                  Erling CruzJACKSON,E. Marshella Tello

## 2016-06-13 NOTE — Progress Notes (Signed)
Echocardiogram Echocardiogram Transesophageal has been performed.  Alyra Patty M 06/13/2016, 9:52 AM 

## 2016-06-14 ENCOUNTER — Encounter (HOSPITAL_COMMUNITY): Payer: Self-pay | Admitting: Surgery

## 2016-06-14 ENCOUNTER — Inpatient Hospital Stay (HOSPITAL_COMMUNITY): Payer: BC Managed Care – PPO

## 2016-06-14 LAB — BASIC METABOLIC PANEL
ANION GAP: 7 (ref 5–15)
BUN: 12 mg/dL (ref 6–20)
CALCIUM: 8.3 mg/dL — AB (ref 8.9–10.3)
CHLORIDE: 106 mmol/L (ref 101–111)
CO2: 23 mmol/L (ref 22–32)
Creatinine, Ser: 1.06 mg/dL (ref 0.61–1.24)
GFR calc non Af Amer: >60 mL/min (ref >60)
Glucose, Bld: 116 mg/dL — ABNORMAL HIGH (ref 65–99)
Potassium: 3.7 mmol/L (ref 3.5–5.1)
Sodium: 136 mmol/L (ref 135–145)

## 2016-06-14 LAB — POCT I-STAT, CHEM 8
BUN: 15 mg/dL (ref 6–20)
CHLORIDE: 99 mmol/L — AB (ref 101–111)
Calcium, Ion: 1.18 mmol/L (ref 1.12–1.23)
Creatinine, Ser: 1.3 mg/dL — ABNORMAL HIGH (ref 0.61–1.24)
Glucose, Bld: 131 mg/dL — ABNORMAL HIGH (ref 65–99)
HEMATOCRIT: 36 % — AB (ref 39.0–52.0)
Hemoglobin: 12.2 g/dL — ABNORMAL LOW (ref 13.0–17.0)
POTASSIUM: 4.1 mmol/L (ref 3.5–5.1)
SODIUM: 137 mmol/L (ref 135–145)
TCO2: 26 mmol/L (ref 0–100)

## 2016-06-14 LAB — CREATININE, SERUM
CREATININE: 1.3 mg/dL — AB (ref 0.61–1.24)
GFR calc Af Amer: >60 mL/min (ref >60)
GFR calc non Af Amer: 54 mL/min — ABNORMAL LOW (ref >60)

## 2016-06-14 LAB — CBC
HEMATOCRIT: 38 % — AB (ref 39.0–52.0)
HEMATOCRIT: 39.9 % (ref 39.0–52.0)
HEMOGLOBIN: 12.9 g/dL — AB (ref 13.0–17.0)
HEMOGLOBIN: 13.9 g/dL (ref 13.0–17.0)
MCH: 30.2 pg (ref 26.0–34.0)
MCH: 31.6 pg (ref 26.0–34.0)
MCHC: 33.9 g/dL (ref 30.0–36.0)
MCHC: 34.8 g/dL (ref 30.0–36.0)
MCV: 89 fL (ref 78.0–100.0)
MCV: 90.7 fL (ref 78.0–100.0)
Platelets: 154 10*3/uL (ref 150–400)
Platelets: 167 10*3/uL (ref 150–400)
RBC: 4.27 MIL/uL (ref 4.22–5.81)
RBC: 4.4 MIL/uL (ref 4.22–5.81)
RDW: 13.7 % (ref 11.5–15.5)
RDW: 14 % (ref 11.5–15.5)
WBC: 14.2 10*3/uL — AB (ref 4.0–10.5)
WBC: 19.1 10*3/uL — ABNORMAL HIGH (ref 4.0–10.5)

## 2016-06-14 LAB — GLUCOSE, CAPILLARY
GLUCOSE-CAPILLARY: 119 mg/dL — AB (ref 65–99)
GLUCOSE-CAPILLARY: 124 mg/dL — AB (ref 65–99)
GLUCOSE-CAPILLARY: 129 mg/dL — AB (ref 65–99)
GLUCOSE-CAPILLARY: 130 mg/dL — AB (ref 65–99)
GLUCOSE-CAPILLARY: 157 mg/dL — AB (ref 65–99)
GLUCOSE-CAPILLARY: 97 mg/dL (ref 65–99)
Glucose-Capillary: 112 mg/dL — ABNORMAL HIGH (ref 65–99)
Glucose-Capillary: 113 mg/dL — ABNORMAL HIGH (ref 65–99)
Glucose-Capillary: 117 mg/dL — ABNORMAL HIGH (ref 65–99)
Glucose-Capillary: 112 mg/dL — ABNORMAL HIGH (ref 65–99)
Glucose-Capillary: 111 mg/dL — ABNORMAL HIGH (ref 65–99)
Glucose-Capillary: 145 mg/dL — ABNORMAL HIGH (ref 65–99)
Glucose-Capillary: 127 mg/dL — ABNORMAL HIGH (ref 65–99)
Glucose-Capillary: 137 mg/dL — ABNORMAL HIGH (ref 65–99)
Glucose-Capillary: 153 mg/dL — ABNORMAL HIGH (ref 65–99)

## 2016-06-14 LAB — MAGNESIUM
MAGNESIUM: 2.2 mg/dL (ref 1.7–2.4)
Magnesium: 2.1 mg/dL (ref 1.7–2.4)

## 2016-06-14 MED ORDER — CHLORHEXIDINE GLUCONATE 0.12 % MT SOLN
15.0000 mL | Freq: Two times a day (BID) | OROMUCOSAL | Status: DC
  Administered 2016-06-14 – 2016-06-18 (×5): 15 mL via OROMUCOSAL
  Filled 2016-06-14 (×6): qty 15

## 2016-06-14 MED ORDER — ORAL CARE MOUTH RINSE
15.0000 mL | Freq: Two times a day (BID) | OROMUCOSAL | Status: DC
  Administered 2016-06-15 – 2016-06-18 (×6): 15 mL via OROMUCOSAL

## 2016-06-14 MED ORDER — POTASSIUM CHLORIDE 10 MEQ/50ML IV SOLN
10.0000 meq | INTRAVENOUS | Status: AC | PRN
  Administered 2016-06-14 (×3): 10 meq via INTRAVENOUS
  Filled 2016-06-14: qty 50

## 2016-06-14 MED ORDER — INSULIN ASPART 100 UNIT/ML ~~LOC~~ SOLN
0.0000 [IU] | SUBCUTANEOUS | Status: DC
  Administered 2016-06-14 (×3): 2 [IU] via SUBCUTANEOUS

## 2016-06-14 MED ORDER — INSULIN DETEMIR 100 UNIT/ML ~~LOC~~ SOLN
15.0000 [IU] | Freq: Once | SUBCUTANEOUS | Status: AC
  Administered 2016-06-14: 15 [IU] via SUBCUTANEOUS
  Filled 2016-06-14: qty 0.15

## 2016-06-14 MED ORDER — POTASSIUM CHLORIDE CRYS ER 20 MEQ PO TBCR
20.0000 meq | EXTENDED_RELEASE_TABLET | Freq: Two times a day (BID) | ORAL | Status: AC
  Administered 2016-06-14 (×2): 20 meq via ORAL
  Filled 2016-06-14 (×2): qty 1

## 2016-06-14 MED ORDER — ENOXAPARIN SODIUM 40 MG/0.4ML ~~LOC~~ SOLN
40.0000 mg | Freq: Every day | SUBCUTANEOUS | Status: DC
  Administered 2016-06-14 – 2016-06-17 (×4): 40 mg via SUBCUTANEOUS
  Filled 2016-06-14 (×4): qty 0.4

## 2016-06-14 MED ORDER — FUROSEMIDE 10 MG/ML IJ SOLN
40.0000 mg | Freq: Once | INTRAMUSCULAR | Status: AC
  Administered 2016-06-14: 40 mg via INTRAVENOUS
  Filled 2016-06-14: qty 4

## 2016-06-14 MED FILL — Mannitol IV Soln 20%: INTRAVENOUS | Qty: 500 | Status: AC

## 2016-06-14 MED FILL — Sodium Bicarbonate IV Soln 8.4%: INTRAVENOUS | Qty: 50 | Status: AC

## 2016-06-14 MED FILL — Heparin Sodium (Porcine) Inj 1000 Unit/ML: INTRAMUSCULAR | Qty: 10 | Status: AC

## 2016-06-14 MED FILL — Lidocaine HCl IV Inj 20 MG/ML: INTRAVENOUS | Qty: 5 | Status: AC

## 2016-06-14 MED FILL — Sodium Chloride IV Soln 0.9%: INTRAVENOUS | Qty: 3000 | Status: AC

## 2016-06-14 MED FILL — Electrolyte-R (PH 7.4) Solution: INTRAVENOUS | Qty: 4000 | Status: AC

## 2016-06-14 NOTE — Progress Notes (Signed)
Patient ID: [name], male   DOB: 10/15/1946, 70 y.o.   MRN: [id number]  SICU Evening Rounds:  Hemodynamically stable in sinus rhythm  Urine output good  Ambulated 150 ft today  Pain under control.

## 2016-06-14 NOTE — Progress Notes (Addendum)
1 Day Post-Op Procedure(s) (LRB): CORONARY ARTERY BYPASS GRAFTING (CABG)x 4 with endoscopic harvesting of right greater saphenous vein (N/A) TRANSESOPHAGEAL ECHOCARDIOGRAM (TEE) (N/A) Subjective: No complaints Some pain with coughing  Objective: Vital signs in last 24 hours: Temp:  [96.3 F (35.7 C)-98.4 F (36.9 C)] 97.9 F (36.6 C) (08/25 0719) Pulse Rate:  [74-84] 74 (08/25 0719) Cardiac Rhythm: Atrial paced (08/24 2000) Resp:  [12-31] 14 (08/25 0719) BP: (95-145)/(60-94) 133/79 (08/25 0700) SpO2:  [96 %-100 %] 100 % (08/25 0719) Arterial Line BP: (82-157)/(45-73) 140/59 (08/25 0719) FiO2 (%):  [40 %-50 %] 40 % (08/24 1815) Weight:  [134.3 kg (296 lb 1.2 oz)] 134.3 kg (296 lb 1.2 oz) (08/25 0557)  Hemodynamic parameters for last 24 hours: PAP: (17-37)/(4-24) 24/8 CO:  [4 L/min-8.8 L/min] 5.5 L/min CI:  [1.6 L/min/m2-3.6 L/min/m2] 2.2 L/min/m2  Intake/Output from previous day: 08/24 0701 - 08/25 0700 In: 5920.6 [P.O.:180; I.V.:3815.6; Blood:485; NG/GT:30; IV Piggyback:1410] Out: 3145 [Urine:2565; Emesis/NG output:20; Blood:100; Chest Tube:460] Intake/Output this shift: No intake/output data recorded.  General appearance: alert and cooperative Neurologic: intact Heart: regular rate and rhythm, S1, S2 normal, no murmur, click, rub or gallop Lungs: clear to auscultation bilaterally Extremities: edema mild Wound: dressings dry  Lab Results:  Recent Labs  06/13/16 2107 06/14/16 0359  WBC 15.8* 14.2*  HGB 12.6* 12.9*  HCT 36.2* 38.0*  PLT 176 154   BMET:  Recent Labs  06/11/16 0921  06/13/16 2025 06/13/16 2107 06/14/16 0359  NA 140  < > 139  --  136  K 3.6  < > 4.1  --  3.7  CL 110  < > 103  --  106  CO2 20*  --   --   --  23  GLUCOSE 122*  < > 157*  --  116*  BUN 19  < > 18  --  12  CREATININE 1.39*  < > 1.10 1.18 1.06  CALCIUM 9.7  --   --   --  8.3*  < > = values in this interval not displayed.  PT/INR:  Recent Labs  06/13/16 1429  LABPROT 17.4*   INR 1.42   ABG    Component Value Date/Time   PHART 7.361 06/13/2016 2035   HCO3 23.1 06/13/2016 2035   TCO2 24 06/13/2016 2035   ACIDBASEDEF 2.0 06/13/2016 2035   O2SAT 98.0 06/13/2016 2035   CBG (last 3)   Recent Labs  06/14/16 0324 06/14/16 0430 06/14/16 0546  GLUCAP 117* 112* 119*   CXR: clear  ECG: sinus rhythm, no acute changes  Assessment/Plan: S/P Procedure(s) (LRB): CORONARY ARTERY BYPASS GRAFTING (CABG)x 4 with endoscopic harvesting of right greater saphenous vein (N/A) TRANSESOPHAGEAL ECHOCARDIOGRAM (TEE) (N/A)  Hemodynamically stable in sinus rhythm. Continue BB. Will resume Hyzaar in a day or two if BP remains stable. Mobilize Diuresis Diabetes control: preop Hgb A1c is 5.4 on no medication so I don't think he has diabetes. Will give him one dose of Levemir today to get off insulin drip for postop hyperglycemia. d/c tubes/lines Continue foley due to diuresing patient and patient in ICU See progression orders   LOS: 1 day    Alleen BorneBryan K Aahna Rossa 06/14/2016

## 2016-06-14 NOTE — Discharge Instructions (Signed)
Coronary Artery Bypass Grafting, Care After °Refer to this sheet in the next few weeks. These instructions provide you with information on caring for yourself after your procedure. Your health care provider may also give you more specific instructions. Your treatment has been planned according to current medical practices, but problems sometimes occur. Call your health care provider if you have any problems or questions after your procedure. °WHAT TO EXPECT AFTER THE PROCEDURE °Recovery from surgery will be different for everyone. Some people feel well after 3 or 4 weeks, while for others it takes longer. After your procedure, it is typical to have the following: °· Nausea and a lack of appetite.   °· Constipation. °· Weakness and fatigue.   °· Depression or irritability.   °· Pain or discomfort at your incision site. °HOME CARE INSTRUCTIONS °· Take medicines only as directed by your health care provider. Do not stop taking medicines or start any new medicines without first checking with your health care provider. °· Take your pulse as directed by your health care provider. °· Perform deep breathing as directed by your health care provider. If you were given a device called an incentive spirometer, use it to practice deep breathing several times a day. Support your chest with a pillow or your arms when you take deep breaths or cough. °· Keep incision areas clean, dry, and protected. Remove or change any bandages (dressings) only as directed by your health care provider. You may have skin adhesive strips over the incision areas. Do not take the strips off. They will fall off on their own. °· Check incision areas daily for any swelling, redness, or drainage. °· If incisions were made in your legs, do the following: °¨ Avoid crossing your legs.   °¨ Avoid sitting for long periods of time. Change positions every 30 minutes.   °¨ Elevate your legs when you are sitting. °· Wear compression stockings as directed by your  health care provider. These stockings help keep blood clots from forming in your legs. °· Take showers once your health care provider approves. Until then, only take sponge baths. Pat incisions dry. Do not rub incisions with a washcloth or towel. Do not take baths, swim, or use a hot tub until your health care provider approves. °· Eat foods that are high in fiber, such as raw fruits and vegetables, whole grains, beans, and nuts. Meats should be lean cut. Avoid canned, processed, and fried foods. °· Drink enough fluid to keep your urine clear or pale yellow. °· Weigh yourself every day. This helps identify if you are retaining fluid that may make your heart and lungs work harder. °· Rest and limit activity as directed by your health care provider. You may be instructed to: °¨ Stop any activity at once if you have chest pain, shortness of breath, irregular heartbeats, or dizziness. Get help right away if you have any of these symptoms. °¨ Move around frequently for short periods or take short walks as directed by your health care provider. Increase your activities gradually. You may need physical therapy or cardiac rehabilitation to help strengthen your muscles and build your endurance. °¨ Avoid lifting, pushing, or pulling anything heavier than 10 lb (4.5 kg) for at least 6 weeks after surgery. °· Do not drive until your health care provider approves.  °· Ask your health care provider when you may return to work. °· Ask your health care provider when you may resume sexual activity. °· Keep all follow-up visits as directed by your health care   provider. This is important. °SEEK MEDICAL CARE IF: °· You have swelling, redness, increasing pain, or drainage at the site of an incision. °· You have a fever. °· You have swelling in your ankles or legs. °· You have pain in your legs.   °· You gain 2 or more pounds (0.9 kg) a day. °· You are nauseous or vomit. °· You have diarrhea.  °SEEK IMMEDIATE MEDICAL CARE IF: °· You have  chest pain that goes to your jaw or arms. °· You have shortness of breath.   °· You have a fast or irregular heartbeat.   °· You notice a "clicking" in your breastbone (sternum) when you move.   °· You have numbness or weakness in your arms or legs. °· You feel dizzy or light-headed.   °MAKE SURE YOU: °· Understand these instructions. °· Will watch your condition. °· Will get help right away if you are not doing well or get worse. °  °This information is not intended to replace advice given to you by your health care provider. Make sure you discuss any questions you have with your health care provider. °  °Document Released: 04/26/2005 Document Revised: 10/28/2014 Document Reviewed: 03/16/2013 °Elsevier Interactive Patient Education ©2016 [employer] ° °

## 2016-06-14 NOTE — Discharge Summary (Signed)
Physician Discharge Summary       [address]       [place] [address]             [phone]    Patient ID: Scott Johnson MRN: [id number] DOB/AGE: [birth year] 70 y.o.  Admit date: 06/13/2016 Discharge date: 06/18/2016  Admission Diagnoses: Coronary artery disease  Active Diagnoses:  1.Essential hypertension 2. Hyperlipidemia 3. Obesity 4. Benign prostate hyperplasia 5. Ischemic chest pain (HCC) 6. Colon polyp 7. Arthritis  Procedure (s):  1. Median Sternotomy 2. Extracorporeal circulation 3.   Coronary artery bypass grafting x 4   Left internal mammary graft to the LAD  SVG to Ramus  Sequential SVG to OM and distal LCX  4.   Endoscopic vein harvest from the right leg by Dr. 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 He first noted it while walking across campus and he has continued to have 2-3/10 substernal chest tightness with walking long distances or up an incline. He was in [place] last month and had recurrent episodes while walking around the city. He saw his PCP after that and was referred to cardiology. Cath on 05/21/2016 showed a 90% calcified ostial dominant LCX stenosis as well as a 75% mid LAD stenosis after the major diagonal. The LM has a long, calcified 40% ostial to mid vessel stenosis. The RCA is small and non-dominant with 90% proximal stenosis. LVEF is normal. He has had no symptoms at rest or with doing activities around his house. He has no associated shortness of breath. Dr. Laneta Simmers discussed the need for coronary artery bypass grafting surgery. Potential risks, benefits, and complications of the surgery were discussed with the patient and he agreed to proceed with surgery. Pre operative carotid duplex showed no  significant bilateral internal carotid artery stenosis. He underwent a CABG x 4 on 06/13/2016.  Brief Hospital Course:  The patient was extubated the evening of surgery without difficulty. He remained afebrile and hemodynamically stable. Theone Murdoch, a line, chest tubes, and foley were removed early in the post operative course. Lopressor was started and titrated accordingly. He was volume over loaded and diuresed. He had ABL anemia. He did not require a post op transfusion. He was weaned off the insulin drip.  The patient's HGA1C pre op was 5.4. The patient was felt surgically stable for transfer from the ICU to [facility] for further convalescence on 06/15/2016. He developed rapid Atrial Fibrillation.  He was treated with IV Amiodarone with successful conversion to NSR.  He has been hypertensive and was restarted on his home regimen of Hyzaar.  His Lopressor was also titrated for better HR and Bp control.  He continues to progress with cardiac rehab. He was ambulating on room air. He has been tolerating a diet and has had a bowel movement. Epicardial pacing wires were removed 08/29. Chest tube sutures will be removed in the office after discharge. The patient is felt surgically stable for discharge today.   Latest Vital Signs: Blood pressure 120/66, pulse 65, temperature 97.9 F (36.6 C), temperature source Oral, resp. rate 20, height 6\' 1"  (1.854 m), weight 284 lb 11.2 oz (129.1 kg), SpO2 96 %.  Physical Exam: Cardiovascular: RRR Pulmonary: Clear to auscultation bilaterally Abdomen: Soft, non tender, bowel sounds present. Extremities: Mild bilateral  lower extremity edema. Wounds: Clean and dry.  No erythema or signs of infection.  Discharge Condition: Stable and discharged to home.  Recent laboratory studies:  Lab Results  Component Value Date   WBC 14.3 (H) 06/15/2016   HGB 12.1 (L) 06/15/2016   HCT 35.9 (L) 06/15/2016   MCV 91.1 06/15/2016   PLT 136 (L) 06/15/2016   Lab Results  Component  Value Date   NA 136 06/15/2016   K 3.6 06/15/2016   CL 103 06/15/2016   CO2 26 06/15/2016   CREATININE 1.03 06/15/2016   GLUCOSE 110 (H) 06/15/2016   Diagnostic Studies:  EXAM: PORTABLE CHEST 1 VIEW  COMPARISON:  June 14, 2016  FINDINGS: The heart size and mediastinal contours are stable. Heart size is probably enlarged. Patient is status post prior median sternotomy and CABG. Right jugular vascular sheath is identified. There is minimal increased pulmonary interstitium bilaterally. Mild atelectasis of left lung base is noted. There is probably a minimal left pleural effusion. There is no pneumothorax. The visualized skeletal structures are stable.  IMPRESSION: Minimal increased pulmonary interstitium bilaterally may be due to mild degree of edema. Mild atelectasis of left lung base. Minimal left pleural effusion.   Electronically Signed   By: Sherian ReinWei-Chen  Lin M.D.   On: 06/15/2016 07:44    Discharge Medications:   Medication List    STOP taking these medications   aspirin 81 MG tablet Replaced by:  aspirin 325 MG EC tablet   isosorbide mononitrate 30 MG 24 hr tablet Commonly known as:  IMDUR   nitroGLYCERIN 0.4 MG SL tablet Commonly known as:  NITROSTAT     TAKE these medications   acetaminophen 325 MG tablet Commonly known as:  TYLENOL Take 2 tablets (650 mg total) by mouth every 6 (six) hours as needed for mild pain.   amiodarone 200 MG tablet Commonly known as:  PACERONE Take 1 tablet (200 mg total) by mouth 2 (two) times daily. For 5 days then take Amiodarone 200 mg by mouth daily thereafter.   aspirin 325 MG EC tablet Take 1 tablet (325 mg total) by mouth daily. Replaces:  aspirin 81 MG tablet   finasteride 5 MG tablet Commonly known as:  PROSCAR Take 5 mg by mouth daily at 12 noon.   losartan-hydrochlorothiazide 100-25 MG tablet Commonly known as:  HYZAAR Take 1 tablet by mouth every morning.   metoprolol tartrate 25 MG  tablet Commonly known as:  LOPRESSOR Take 1 tablet (25 mg total) by mouth 2 (two) times daily.   simvastatin 20 MG tablet Commonly known as:  ZOCOR Take 20 mg by mouth every evening.   traMADol 50 MG tablet Commonly known as:  ULTRAM Take 1 tablet (50 mg total) by mouth every 6 (six) hours as needed for moderate pain.     The patient has been discharged on:   1.Beta Blocker:  Yes [ x  ]                              No   [   ]                              If No, reason:  2.Ace Inhibitor/ARB: Yes [ x  ]  No  [    ]                                     If No, reason:  3.Statin:   Yes [  x ]                  No  [   ]                  If No, reason:  4.Ecasa:  Yes  [x   ]                  No   [   ]                  If No, reason:  Follow Up Appointments: Follow-up Information    Alleen Borne, MD Follow up on 07/17/2016.   Specialty:  Cardiothoracic Surgery Why:  PA/LAT CXR (to be taken at [facility] which is in the same building as Dr. Sharee Pimple office) on 07/17/2016 at 9:00 am;Appointment time is at 9:30 am Contact information: [address] [place] [address] [phone]        Nurse Follow up on 06/25/2016.   Why:  Appointment is with nurse only to have chest tube sutures removed. Appointment time is at 10:00 am Contact information: [address] [employer] [address] [place] [address]       Chilton Si, MD Follow up on 06/26/2016.   Specialty:  Cardiology Why:  Appointment time is at 2:45 pm Contact information: [address] [place] [address] [phone]           Signed: Doree Fudge MPA-C 06/18/2016, 7:40 AM

## 2016-06-14 NOTE — Care Management Note (Signed)
Case Management Note  Patient Details  Name: Scott MurphyFranklin Johnson MRN: [id number] Date of Birth: 03/31/1946  Subjective/Objective:       S/p CABG             Action/Plan:   PTA independent.  Pt has stated that no one will come see him, [place].  CM consulted CSW for possible placement at discharge   Expected Discharge Date:                  Expected Discharge Plan:  Skilled Nursing Facility  In-House Referral:  Clinical Social Work  Discharge planning Services  CM Consult  Post Acute Care Choice:    Choice offered to:     DME Arranged:    DME Agency:     HH Arranged:    HH Agency:     Status of Service:  In process, will continue to follow  If discussed at Long Length of Stay Meetings, dates discussed:    Additional Comments:  Cherylann ParrClaxton, Michaela Broski S, RN 06/14/2016, 2:58 PM

## 2016-06-15 ENCOUNTER — Inpatient Hospital Stay (HOSPITAL_COMMUNITY): Payer: BC Managed Care – PPO

## 2016-06-15 LAB — GLUCOSE, CAPILLARY
GLUCOSE-CAPILLARY: 109 mg/dL — AB (ref 65–99)
GLUCOSE-CAPILLARY: 110 mg/dL — AB (ref 65–99)
Glucose-Capillary: 116 mg/dL — ABNORMAL HIGH (ref 65–99)

## 2016-06-15 LAB — CBC
HEMATOCRIT: 35.9 % — AB (ref 39.0–52.0)
HEMOGLOBIN: 12.1 g/dL — AB (ref 13.0–17.0)
MCH: 30.7 pg (ref 26.0–34.0)
MCHC: 33.7 g/dL (ref 30.0–36.0)
MCV: 91.1 fL (ref 78.0–100.0)
Platelets: 136 10*3/uL — ABNORMAL LOW (ref 150–400)
RBC: 3.94 MIL/uL — AB (ref 4.22–5.81)
RDW: 13.9 % (ref 11.5–15.5)
WBC: 14.3 10*3/uL — AB (ref 4.0–10.5)

## 2016-06-15 LAB — BASIC METABOLIC PANEL
Anion gap: 7 (ref 5–15)
BUN: 12 mg/dL (ref 6–20)
CO2: 26 mmol/L (ref 22–32)
Calcium: 8.5 mg/dL — ABNORMAL LOW (ref 8.9–10.3)
Chloride: 103 mmol/L (ref 101–111)
Creatinine, Ser: 1.03 mg/dL (ref 0.61–1.24)
Glucose, Bld: 110 mg/dL — ABNORMAL HIGH (ref 65–99)
Potassium: 3.6 mmol/L (ref 3.5–5.1)
SODIUM: 136 mmol/L (ref 135–145)

## 2016-06-15 MED ORDER — ASPIRIN EC 325 MG PO TBEC
325.0000 mg | DELAYED_RELEASE_TABLET | Freq: Every day | ORAL | Status: DC
  Administered 2016-06-16 – 2016-06-18 (×3): 325 mg via ORAL
  Filled 2016-06-15 (×3): qty 1

## 2016-06-15 MED ORDER — ONDANSETRON HCL 4 MG PO TABS: 4.0000 mg | ORAL_TABLET | Freq: Four times a day (QID) | ORAL | Status: DC | PRN

## 2016-06-15 MED ORDER — SODIUM CHLORIDE 0.9% FLUSH
3.0000 mL | Freq: Two times a day (BID) | INTRAVENOUS | Status: DC
  Administered 2016-06-15 – 2016-06-17 (×4): 3 mL via INTRAVENOUS

## 2016-06-15 MED ORDER — LOSARTAN POTASSIUM 50 MG PO TABS
100.0000 mg | ORAL_TABLET | Freq: Every day | ORAL | Status: DC
  Administered 2016-06-15 – 2016-06-18 (×4): 100 mg via ORAL
  Filled 2016-06-15 (×4): qty 2

## 2016-06-15 MED ORDER — DOCUSATE SODIUM 100 MG PO CAPS
200.0000 mg | ORAL_CAPSULE | Freq: Every day | ORAL | Status: DC
  Administered 2016-06-16 – 2016-06-17 (×2): 200 mg via ORAL
  Filled 2016-06-15 (×2): qty 2

## 2016-06-15 MED ORDER — FINASTERIDE 5 MG PO TABS
5.0000 mg | ORAL_TABLET | Freq: Every day | ORAL | Status: DC
  Administered 2016-06-15 – 2016-06-18 (×4): 5 mg via ORAL
  Filled 2016-06-15 (×4): qty 1

## 2016-06-15 MED ORDER — POTASSIUM CHLORIDE 10 MEQ/50ML IV SOLN
10.0000 meq | INTRAVENOUS | Status: AC | PRN
  Administered 2016-06-15 (×3): 10 meq via INTRAVENOUS
  Filled 2016-06-15: qty 50

## 2016-06-15 MED ORDER — SODIUM CHLORIDE 0.9% FLUSH: 3.0000 mL | INTRAVENOUS | Status: DC | PRN

## 2016-06-15 MED ORDER — MOVING RIGHT ALONG BOOK
Freq: Once | Status: AC
  Administered 2016-06-15: 11:00:00
  Filled 2016-06-15: qty 1

## 2016-06-15 MED ORDER — TRAMADOL HCL 50 MG PO TABS: 50.0000 mg | ORAL_TABLET | ORAL | Status: DC | PRN

## 2016-06-15 MED ORDER — LOSARTAN POTASSIUM-HCTZ 100-25 MG PO TABS: 1.0000 | ORAL_TABLET | ORAL | Status: DC

## 2016-06-15 MED ORDER — BISACODYL 5 MG PO TBEC
10.0000 mg | DELAYED_RELEASE_TABLET | Freq: Every day | ORAL | Status: DC | PRN
  Filled 2016-06-15: qty 2

## 2016-06-15 MED ORDER — BISACODYL 10 MG RE SUPP: 10.0000 mg | Freq: Every day | RECTAL | Status: DC | PRN

## 2016-06-15 MED ORDER — HYDROCHLOROTHIAZIDE 25 MG PO TABS
25.0000 mg | ORAL_TABLET | Freq: Every day | ORAL | Status: DC
  Administered 2016-06-15 – 2016-06-18 (×4): 25 mg via ORAL
  Filled 2016-06-15 (×4): qty 1

## 2016-06-15 MED ORDER — METOPROLOL TARTRATE 25 MG PO TABS
25.0000 mg | ORAL_TABLET | Freq: Two times a day (BID) | ORAL | Status: DC
  Administered 2016-06-15: 25 mg via ORAL
  Filled 2016-06-15: qty 1

## 2016-06-15 MED ORDER — ONDANSETRON HCL 4 MG/2ML IJ SOLN: 4.0000 mg | Freq: Four times a day (QID) | INTRAMUSCULAR | Status: DC | PRN

## 2016-06-15 MED ORDER — POTASSIUM CHLORIDE CRYS ER 20 MEQ PO TBCR: 40.0000 meq | EXTENDED_RELEASE_TABLET | Freq: Once | ORAL | Status: AC

## 2016-06-15 MED ORDER — SODIUM CHLORIDE 0.9 % IV SOLN: 250.0000 mL | INTRAVENOUS | Status: DC | PRN

## 2016-06-15 MED ORDER — ACETAMINOPHEN 325 MG PO TABS: 650.0000 mg | ORAL_TABLET | Freq: Four times a day (QID) | ORAL | Status: DC | PRN

## 2016-06-15 MED ORDER — FAMOTIDINE 20 MG PO TABS
20.0000 mg | ORAL_TABLET | Freq: Two times a day (BID) | ORAL | Status: DC
  Administered 2016-06-15 – 2016-06-18 (×6): 20 mg via ORAL
  Filled 2016-06-15 (×6): qty 1

## 2016-06-15 NOTE — Progress Notes (Signed)
Patient has just walked from 2S. Will follow up with patient on Monday.Scott LighterMaria Murielle Stang, RN,BSN 06/15/2016 2:47 PM

## 2016-06-15 NOTE — Progress Notes (Signed)
2 Days Post-Op Procedure(s) (LRB): CORONARY ARTERY BYPASS GRAFTING (CABG)x 4 with endoscopic harvesting of right greater saphenous vein (N/A) TRANSESOPHAGEAL ECHOCARDIOGRAM (TEE) (N/A) Subjective:  No complaints. Ambulated this am and sat up in chair for 3 hrs. Pain well-controlled with Tylenol.  Objective: Vital signs in last 24 hours: Temp:  [97.5 F (36.4 C)-98.2 F (36.8 C)] 98 F (36.7 C) (08/26 0805) Pulse Rate:  [26-85] 84 (08/26 1000) Cardiac Rhythm: Normal sinus rhythm (08/26 0800) Resp:  [13-28] 28 (08/26 1000) BP: (109-166)/(59-101) 148/67 (08/26 1000) SpO2:  [84 %-100 %] 97 % (08/26 1000) Weight:  [132.2 kg (291 lb 7.2 oz)] 132.2 kg (291 lb 7.2 oz) (08/26 0546)  Hemodynamic parameters for last 24 hours: PAP: (31)/(17) 31/17  Intake/Output from previous day: 08/25 0701 - 08/26 0700 In: 1030 [P.O.:380; I.V.:400; IV Piggyback:250] Out: 1950 [Urine:1350; Chest Tube:600] Intake/Output this shift: Total I/O In: 60 [I.V.:60] Out: 115 [Urine:115]  General appearance: alert and cooperative Neurologic: intact Heart: regular rate and rhythm, S1, S2 normal, no murmur, click, rub or gallop Lungs: clear to auscultation bilaterally Extremities: extremities normal, atraumatic, no cyanosis or edema Wound: dressing dry  Lab Results:  Recent Labs  06/14/16 1822 06/15/16 0420  WBC 19.1* 14.3*  HGB 13.9 12.1*  HCT 39.9 35.9*  PLT 167 136*   BMET:  Recent Labs  06/14/16 0359 06/14/16 1739 06/14/16 1740 06/15/16 0420  NA 136 137  --  136  K 3.7 4.1  --  3.6  CL 106 99*  --  103  CO2 23  --   --  26  GLUCOSE 116* 131*  --  110*  BUN 12 15  --  12  CREATININE 1.06 1.30* 1.30* 1.03  CALCIUM 8.3*  --   --  8.5*    PT/INR:  Recent Labs  06/13/16 1429  LABPROT 17.4*  INR 1.42   ABG    Component Value Date/Time   PHART 7.361 06/13/2016 2035   HCO3 23.1 06/13/2016 2035   TCO2 26 06/14/2016 1739   ACIDBASEDEF 2.0 06/13/2016 2035   O2SAT 98.0 06/13/2016 2035    CBG (last 3)   Recent Labs  06/15/16 0009 06/15/16 0348 06/15/16 0758  GLUCAP 116* 109* 110*   CLINICAL DATA:  Postoperative coronary artery bypass grafting  EXAM: PORTABLE CHEST 1 VIEW  COMPARISON:  June 14, 2016  FINDINGS: The heart size and mediastinal contours are stable. Heart size is probably enlarged. Patient is status post prior median sternotomy and CABG. Right jugular vascular sheath is identified. There is minimal increased pulmonary interstitium bilaterally. Mild atelectasis of left lung base is noted. There is probably a minimal left pleural effusion. There is no pneumothorax. The visualized skeletal structures are stable.  IMPRESSION: Minimal increased pulmonary interstitium bilaterally may be due to mild degree of edema. Mild atelectasis of left lung base. Minimal left pleural effusion.   Electronically Signed   By: Sherian ReinWei-Chen  Lin M.D.   On: 06/15/2016 07:44  Assessment/Plan: S/P Procedure(s) (LRB): CORONARY ARTERY BYPASS GRAFTING (CABG)x 4 with endoscopic harvesting of right greater saphenous vein (N/A) TRANSESOPHAGEAL ECHOCARDIOGRAM (TEE) (N/A)  He remains hemodynamically stable in sinus rhythm. BP 140's so will increase Lopressor to 25 bid and resume Hyzaar.  Glucose under good control. Stop CBG's.  Will transfer to 2W and continue mobilization.   LOS: 2 days    Alleen BorneBryan K Bartle 06/15/2016

## 2016-06-15 NOTE — Progress Notes (Signed)
Patient ordered for transfer to 2W. Report called to Fayrene FearingJames, RN and all questions answered. Pt ambulated to 2W with minimal assist. Patient connected to central tele monitoring and receiving nurse in room.  Evern BioVernon, Bilan Tedesco E 06/15/2016 1300

## 2016-06-16 MED ORDER — METOPROLOL TARTRATE 50 MG PO TABS
50.0000 mg | ORAL_TABLET | Freq: Two times a day (BID) | ORAL | Status: DC
  Administered 2016-06-16 (×2): 50 mg via ORAL
  Filled 2016-06-16 (×2): qty 1

## 2016-06-16 MED ORDER — ISOSORBIDE MONONITRATE ER 30 MG PO TB24: 30.0000 mg | ORAL_TABLET | Freq: Every day | ORAL | Status: DC

## 2016-06-16 MED ORDER — AMIODARONE HCL IN DEXTROSE 360-4.14 MG/200ML-% IV SOLN
60.0000 mg/h | INTRAVENOUS | Status: DC
  Administered 2016-06-16: 60 mg/h via INTRAVENOUS
  Filled 2016-06-16: qty 200

## 2016-06-16 MED ORDER — LACTULOSE 10 GM/15ML PO SOLN: 20.0000 g | Freq: Every day | ORAL | Status: DC | PRN

## 2016-06-16 MED ORDER — AMIODARONE LOAD VIA INFUSION
150.0000 mg | Freq: Once | INTRAVENOUS | Status: AC
  Administered 2016-06-16: 150 mg via INTRAVENOUS
  Filled 2016-06-16: qty 83.34

## 2016-06-16 MED ORDER — AMIODARONE HCL 200 MG PO TABS
400.0000 mg | ORAL_TABLET | Freq: Two times a day (BID) | ORAL | Status: DC
  Administered 2016-06-16 (×2): 400 mg via ORAL
  Filled 2016-06-16 (×2): qty 2

## 2016-06-16 MED ORDER — AMIODARONE HCL IN DEXTROSE 360-4.14 MG/200ML-% IV SOLN
30.0000 mg/h | INTRAVENOUS | Status: DC
  Filled 2016-06-16: qty 200

## 2016-06-16 NOTE — Progress Notes (Addendum)
      [address]       [place] [id number]             [phone]      3 Days Post-Op Procedure(s) (LRB): CORONARY ARTERY BYPASS GRAFTING (CABG)x 4 with endoscopic harvesting of right greater saphenous vein (N/A) TRANSESOPHAGEAL ECHOCARDIOGRAM (TEE) (N/A)   Subjective:  Scott Johnson states he had a rough night.  He developed A. Fib very early this morning after getting up to the bathroom and he states that is unpleasant.  He is ambulating.  No BM yet, + flatus  Objective: Vital signs in last 24 hours: Temp:  [97.9 F (36.6 C)-98.9 F (37.2 C)] 98.5 F (36.9 C) (08/27 0800) Pulse Rate:  [74-135] 76 (08/27 0800) Cardiac Rhythm: Normal sinus rhythm (08/27 0734) Resp:  [17-28] 18 (08/27 0800) BP: (116-151)/(66-77) 137/67 (08/27 0800) SpO2:  [95 %-99 %] 99 % (08/27 0800) Weight:  [287 lb 8 oz (130.4 kg)] 287 lb 8 oz (130.4 kg) (08/27 0414)  Intake/Output from previous day: 08/26 0701 - 08/27 0700 In: 440 [P.O.:360; I.V.:80] Out: 160 [Urine:160]  General appearance: alert, cooperative and no distress Heart: regular rate and rhythm Lungs: clear to auscultation bilaterally Abdomen: soft, non-tender; bowel sounds normal; no masses,  no organomegaly Extremities: edema trace Wound: clean and dry, ecchymosis RLE   Lab Results:  Recent Labs  06/14/16 1822 06/15/16 0420  WBC 19.1* 14.3*  HGB 13.9 12.1*  HCT 39.9 35.9*  PLT 167 136*   BMET:  Recent Labs  06/14/16 0359 06/14/16 1739 06/14/16 1740 06/15/16 0420  NA 136 137  --  136  K 3.7 4.1  --  3.6  CL 106 99*  --  103  CO2 23  --   --  26  GLUCOSE 116* 131*  --  110*  BUN 12 15  --  12  CREATININE 1.06 1.30* 1.30* 1.03  CALCIUM 8.3*  --   --  8.5*    PT/INR:  Recent Labs  06/13/16 1429  LABPROT 17.4*  INR 1.42   ABG    Component Value Date/Time   PHART 7.361 06/13/2016 2035   HCO3 23.1 06/13/2016 2035   TCO2 26 06/14/2016 1739   ACIDBASEDEF 2.0 06/13/2016 2035   O2SAT 98.0  06/13/2016 2035   CBG (last 3)   Recent Labs  06/15/16 0009 06/15/16 0348 06/15/16 0758  GLUCAP 116* 109* 110*    Assessment/Plan: S/P Procedure(s) (LRB): CORONARY ARTERY BYPASS GRAFTING (CABG)x 4 with endoscopic harvesting of right greater saphenous vein (N/A) TRANSESOPHAGEAL ECHOCARDIOGRAM (TEE) (N/A)  1. CV- Atrial Fibrillation overnight, currently NSR on IV Amiodarone drip, remains hypertensive- will continue IV Amiodarone.. Will convert to oral in AM, continue Hyzaar at home dose, will increase Lopressor 2. Pulm- no acute issues, continue IS 3. Renal- weight is below baseline, minimal edema appreciated 4. GI- lactulose prn constipation 5. Dispo- patient with rapid A. Fib overnight, continue IV Amiodarone, increase Lopressor... Will  Plan to d/c EPW in AM if no further arrythymia   LOS: 3 days    Lowella DandyBARRETT, ERIN 06/16/2016   Chart reviewed, patient examined, agree with above. He is back in sinus rhythm so will convert amio to po since he only has a peripheral IV to avoid phlebitis.

## 2016-06-16 NOTE — Progress Notes (Addendum)
Patient went for a walk. 1 Lap around the unit and down a hallway. Tolerated well, no assistive devices used.

## 2016-06-16 NOTE — Progress Notes (Signed)
Patient ambulated 300 ft without a walker and tolerated it well.

## 2016-06-17 MED ORDER — AMIODARONE HCL 200 MG PO TABS
400.0000 mg | ORAL_TABLET | Freq: Two times a day (BID) | ORAL | Status: DC
  Administered 2016-06-17 – 2016-06-18 (×3): 400 mg via ORAL
  Filled 2016-06-17 (×3): qty 2

## 2016-06-17 MED ORDER — MAGNESIUM CITRATE PO SOLN
300.0000 mL | Freq: Once | ORAL | Status: AC
  Administered 2016-06-17: 300 mL via ORAL
  Filled 2016-06-17: qty 592

## 2016-06-17 MED ORDER — AMIODARONE HCL 200 MG PO TABS: 200.0000 mg | ORAL_TABLET | Freq: Every day | ORAL | Status: DC

## 2016-06-17 MED ORDER — METOPROLOL TARTRATE 25 MG PO TABS
25.0000 mg | ORAL_TABLET | Freq: Two times a day (BID) | ORAL | Status: DC
  Administered 2016-06-17 – 2016-06-18 (×3): 25 mg via ORAL
  Filled 2016-06-17 (×3): qty 1

## 2016-06-17 NOTE — Progress Notes (Signed)
CARDIAC REHAB PHASE I   PRE:  Rate/Rhythm: 74 SR  BP:  Supine:   Sitting: 142/71  Standing:    SaO2: 98%RA  MODE:  Ambulation: 690 ft   POST:  Rate/Rhythm: 98 SR  BP:  Supine:   Sitting: 126/75  Standing:    SaO2: 98%RA 1005-1030 Pt walked 690 ft on RA with hand held asst. Tolerated well. Remained in NSR. To sitting on side of bed after walk.   Luetta Nuttingharlene Danaka Llera, RN BSN  06/17/2016 10:27 AM

## 2016-06-17 NOTE — Progress Notes (Signed)
Pacing wires have been removed per protocol. Wire ends were intact upon removal. Gauze dressings were applied to sites. Pt's vitals are stable. BP is being monitored every 15 minutes, per protocol. Pt is resting in bed with call light within reach. Bedrest for 1 hour. Will continue to monitor.   Berdine DanceLauren Moffitt BSN, RN

## 2016-06-17 NOTE — Progress Notes (Addendum)
      [address]       [place] [id number]             [phone]        4 Days Post-Op Procedure(s) (LRB): CORONARY ARTERY BYPASS GRAFTING (CABG)x 4 with endoscopic harvesting of right greater saphenous vein (N/A) TRANSESOPHAGEAL ECHOCARDIOGRAM (TEE) (N/A)  Subjective: Patient passing flatus but no bowel movement yet.  Objective: Vital signs in last 24 hours: Temp:  [98.3 F (36.8 C)-98.6 F (37 C)] 98.3 F (36.8 C) (08/28 0602) Pulse Rate:  [63-78] 63 (08/28 0602) Cardiac Rhythm: Normal sinus rhythm (08/27 1900) Resp:  [18-20] 20 (08/28 0602) BP: (110-137)/(60-73) 122/70 (08/28 0602) SpO2:  [95 %-100 %] 99 % (08/28 0602) Weight:  [287 lb 6.4 oz (130.4 kg)] 287 lb 6.4 oz (130.4 kg) (08/28 0602)  Pre op weight 131.8 kg Current Weight  06/17/16 287 lb 6.4 oz (130.4 kg)       Intake/Output from previous day: 08/27 0701 - 08/28 0700 In: 243 [P.O.:240; I.V.:3] Out: 100 [Urine:100]   Physical Exam:  Cardiovascular: RRR Pulmonary: Clear to auscultation bilaterally Abdomen: Soft, non tender, bowel sounds present. Extremities: Mild bilateral lower extremity edema. Wounds: Clean and dry.  No erythema or signs of infection.  Lab Results: CBC: Recent Labs  06/14/16 1822 06/15/16 0420  WBC 19.1* 14.3*  HGB 13.9 12.1*  HCT 39.9 35.9*  PLT 167 136*   BMET:  Recent Labs  06/14/16 1739 06/14/16 1740 06/15/16 0420  NA 137  --  136  K 4.1  --  3.6  CL 99*  --  103  CO2  --   --  26  GLUCOSE 131*  --  110*  BUN 15  --  12  CREATININE 1.30* 1.30* 1.03  CALCIUM  --   --  8.5*    PT/INR:  Lab Results  Component Value Date   INR 1.42 06/13/2016   INR 1.11 06/11/2016   INR 1.1 05/15/2016   ABG:  INR: Will add last result for INR, ABG once components are confirmed Will add last 4 CBG results once components are confirmed  Assessment/Plan:  1. CV - Previous a fib. SR in the low 60's this am. On Amiodarone 400 mg bid,  Lopressor 50 mg  bid, HCTZ 25 mg daily, and Cozaar 100 mg daily. Will decrease Lopressor to 25 mg bid. Monitor HR as may need to decrease Amiodarone. 2.  Pulmonary - On room air. Encourage incentive spirometer.  3.  Acute blood loss anemia - Last H and H 12.1 and 35.9 4. Remove EPW 5. Mild thrombocytopenia-last platelets 136,000 6. LOC constipation 7. Hope to discharge in am if maintains SR  ZIMMERMAN,DONIELLE MPA-C 06/17/2016,7:20 AM   Chart reviewed, patient examined, agree with above. He feels well, just waiting to get bowels moving.

## 2016-06-18 MED ORDER — TRAMADOL HCL 50 MG PO TABS: 50.0000 mg | ORAL_TABLET | Freq: Four times a day (QID) | ORAL | 0 refills | Status: DC | PRN

## 2016-06-18 MED ORDER — ASPIRIN 325 MG PO TBEC: 325.0000 mg | DELAYED_RELEASE_TABLET | Freq: Every day | ORAL | 0 refills | Status: DC

## 2016-06-18 MED ORDER — ACETAMINOPHEN 325 MG PO TABS: 650.0000 mg | ORAL_TABLET | Freq: Four times a day (QID) | ORAL | Status: DC | PRN

## 2016-06-18 MED ORDER — AMIODARONE HCL 200 MG PO TABS
200.0000 mg | ORAL_TABLET | Freq: Two times a day (BID) | ORAL | 1 refills | Status: DC
Start: 2016-06-18 — End: 2017-01-03

## 2016-06-18 NOTE — Progress Notes (Signed)
Pt has been discharged home. IV was removed with no complications. Telemetry box was removed. Pt received discharge instructions and all questions were answered. Pt left the unit with all of belongings. Pt left the unit via wheelchair and was [facility]. Pt was in no distress at time of discharge.  Berdine DanceLauren Moffitt BSN, RN

## 2016-06-18 NOTE — Progress Notes (Signed)
      [address]       [place] [id number]             [phone]        5 Days Post-Op Procedure(s) (LRB): CORONARY ARTERY BYPASS GRAFTING (CABG)x 4 with endoscopic harvesting of right greater saphenous vein (N/A) TRANSESOPHAGEAL ECHOCARDIOGRAM (TEE) (N/A)  Subjective: Patient had large bowel movement. No other complaints.  Objective: Vital signs in last 24 hours: Temp:  [97.9 F (36.6 C)-98.3 F (36.8 C)] 97.9 F (36.6 C) (08/29 0539) Pulse Rate:  [60-74] 65 (08/29 0539) Cardiac Rhythm: Normal sinus rhythm (08/28 1900) Resp:  [20] 20 (08/29 0539) BP: (109-146)/(59-71) 120/66 (08/29 0539) SpO2:  [95 %-100 %] 96 % (08/29 0539) Weight:  [284 lb 11.2 oz (129.1 kg)] 284 lb 11.2 oz (129.1 kg) (08/29 0539)  Pre op weight 131.8 kg Current Weight  06/18/16 284 lb 11.2 oz (129.1 kg)       Intake/Output from previous day: 08/28 0701 - 08/29 0700 In: 600 [P.O.:600] Out: -    Physical Exam:  Cardiovascular: RRR Pulmonary: Clear to auscultation bilaterally Abdomen: Soft, non tender, bowel sounds present. Extremities: Mild bilateral lower extremity edema. Wounds: Clean and dry.  No erythema or signs of infection.  Lab Results: CBC:No results for input(s): WBC, HGB, HCT, PLT in the last 72 hours. BMET: No results for input(s): NA, K, CL, CO2, GLUCOSE, BUN, CREATININE, CALCIUM in the last 72 hours.  PT/INR:  Lab Results  Component Value Date   INR 1.42 06/13/2016   INR 1.11 06/11/2016   INR 1.1 05/15/2016   ABG:  INR: Will add last result for INR, ABG once components are confirmed Will add last 4 CBG results once components are confirmed  Assessment/Plan:  1. CV - Previous a fib. SR in the low 60's this am. On Amiodarone 400 mg bid,  Lopressor 25 mg bid, HCTZ 25 mg daily, and Cozaar 100 mg daily. Will decrease Amiodarone at discharge. 2.  Pulmonary - On room air. Encourage incentive spirometer.  3.  Acute blood loss anemia - Last H and H 12.1  and 35.9 4. Mild thrombocytopenia-last platelets 136,000 5. Discharge  Rune Mendez MPA-C 06/18/2016,7:22 AM

## 2016-06-18 NOTE — Progress Notes (Signed)
[phone] Education completed with pt who voiced understanding. Did not want to watch discharge video. Reviewed IS, heart healthy diet, ex ed and sternal precautions. Discussed CRP 2 and will refer to [place] program. Luetta Nuttingharlene Briley Bumgarner RN BSN 06/18/2016 9:47 AM

## 2016-06-25 ENCOUNTER — Encounter (INDEPENDENT_AMBULATORY_CARE_PROVIDER_SITE_OTHER): Payer: Self-pay

## 2016-06-25 DIAGNOSIS — Z4802 Encounter for removal of sutures: Secondary | ICD-10-CM

## 2016-06-25 DIAGNOSIS — Z951 Presence of aortocoronary bypass graft: Secondary | ICD-10-CM

## 2016-06-26 ENCOUNTER — Ambulatory Visit (INDEPENDENT_AMBULATORY_CARE_PROVIDER_SITE_OTHER): Payer: BC Managed Care – PPO | Admitting: Cardiovascular Disease

## 2016-06-26 ENCOUNTER — Encounter: Payer: Self-pay | Admitting: Cardiovascular Disease

## 2016-06-26 VITALS — BP 120/77 | HR 63 | Ht 72.0 in | Wt 281.6 lb

## 2016-06-26 DIAGNOSIS — Z951 Presence of aortocoronary bypass graft: Secondary | ICD-10-CM

## 2016-06-26 DIAGNOSIS — E785 Hyperlipidemia, unspecified: Secondary | ICD-10-CM

## 2016-06-26 DIAGNOSIS — I10 Essential (primary) hypertension: Secondary | ICD-10-CM | POA: Diagnosis not present

## 2016-06-26 DIAGNOSIS — E669 Obesity, unspecified: Secondary | ICD-10-CM

## 2016-06-26 MED ORDER — ATORVASTATIN CALCIUM 40 MG PO TABS: 40.0000 mg | ORAL_TABLET | Freq: Every day | ORAL | 5 refills | Status: DC

## 2016-06-26 NOTE — Progress Notes (Signed)
Cardiology Office Note   Date:  06/26/2016   ID:  Erskine Steinfeldt, DOB July 05, 1946, MRN [id number]  PCP:   Duane Lope, MD  Cardiologist:   Chilton Si, MD   Chief Complaint  Patient presents with  . Follow-up    Pt states no Sx or concerns.       History of Present Illness: Scott Johnson is a 70 y.o. 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  He was seen in clinic 05/15/16 and referred for LHC due to classic symptoms of angina.  Mr. Howry had a LHC 05/21/16 that revealed severe 3 vessel CAD.  He underwent 4 vessel CABG with Dr. Laneta Simmers on 06/13/16.  He had post-operative atrial fibrillation managed with IV amiodarone and successfully converted to sinus rhythm.  Since surgery he has been feeling well.  He walks three times daily for 10 minutes each time.  He feels well with this activity and denies chest pain or shortness of breath.  He has not noted any lower extremity edema, orthopnea or PND.  He is pleased with how he is healing postoperatively and only notes mild chest wall pain.  He notes mild lightheadedness with positional changes but denies syncope or falls. He has been working to make dietary changes by eating mor salads and green vegetables.  He lost 7 lb in the last two months.   Past Medical History:  Diagnosis Date  . Arthritis   . Benign prostate hyperplasia    takes Proscar daily  . Colon polyp    benign  . Coronary artery disease   . Essential hypertension 05/15/2016  . History of bronchitis    many yrs ago  . Hyperlipidemia    takes Zocor daily   . Hypertension    takes Imdur,Metoprolol,and Losartan-HCTZ daily  . Joint pain     Past Surgical History:  Procedure Laterality Date  . CARDIAC CATHETERIZATION N/A 05/21/2016   Procedure: Left Heart Cath and  Coronary Angiography;  Surgeon: Marykay Lex, MD;  Location: [facility] INVASIVE CV LAB;  Service: Cardiovascular;  Laterality: N/A;  . COLONOSCOPY    . CORONARY ARTERY BYPASS GRAFT N/A 06/13/2016   Procedure: CORONARY ARTERY BYPASS GRAFTING (CABG)x 4 with endoscopic harvesting of right greater saphenous vein;  Surgeon: Alleen Borne, MD;  Location: [facility] OR;  Service: Open Heart Surgery;  Laterality: N/A;  . EYE SURGERY Bilateral 2015   Cataract removal  . TEE WITHOUT CARDIOVERSION N/A 06/13/2016   Procedure: TRANSESOPHAGEAL ECHOCARDIOGRAM (TEE);  Surgeon: Alleen Borne, MD;  Location: [facility];  Service: Open Heart Surgery;  Laterality: N/A;  . 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 acetaminophen (TYLENOL) 325 MG tablet Take 2 tablets (650 mg total) by mouth every 6 (six) hours as needed for mild pain.    Marland Kitchen amiodarone (PACERONE) 200 MG tablet Take 1 tablet (200 mg total) by mouth 2 (two) times daily. For 5 days then take Amiodarone 200 mg by mouth daily thereafter. 60 tablet 1  . aspirin EC 325 MG EC tablet Take 1 tablet (325 mg total) by mouth  daily. 30 tablet 0  . finasteride (PROSCAR) 5 MG tablet Take 5 mg by mouth daily at 12 noon.   3  . losartan-hydrochlorothiazide (HYZAAR) 100-25 MG tablet Take 1 tablet by mouth every morning.   1  . metoprolol tartrate (LOPRESSOR) 25 MG tablet Take 1 tablet (25 mg total) by mouth 2 (two) times daily. 60 tablet 3  . simvastatin (ZOCOR) 20 MG tablet Take 20 mg by mouth every evening.  3  . traMADol (ULTRAM) 50 MG tablet Take 1 tablet (50 mg total) by mouth every 6 (six) hours as needed for moderate pain. 28 tablet 0   No current facility-administered medications for this visit.     Allergies:    Penicillins    Social History:  The patient  reports that he has never smoked. He has never used smokeless tobacco. He reports that he does not drink alcohol or use drugs.   Family History:  The patient's family history includes COPD in his sister; Cancer in his sister; Heart attack in his paternal grandmother; Heart disease in his maternal grandmother; Heart failure in his father, maternal grandfather, and mother; Hypertension in his mother.    ROS:  Please see the history of present illness.   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   All other systems are reviewed and negative.    PHYSICAL EXAM: VS:  BP 120/77   Pulse 63   Ht 6' (1.829 m)   Wt 281 lb 9.6 oz (127.7 kg)   BMI 38.19 kg/m  , BMI Body mass index is 38.19 kg/m. GENERAL:  Well appearing.  Obese HEENT:  Pupils equal round and reactive, fundi not visualized, oral mucosa unremarkable NECK:  No jugular venous distention, waveform within normal limits, carotid upstroke brisk and symmetric, no bruits, no thyromegaly CHEST: Midline surgical incision healing well.  No erythema or drainage. LYMPHATICS:  No cervical adenopathy LUNGS:  Clear to auscultation bilaterally HEART:  RRR.  PMI not displaced or sustained,S1 and S2 within normal limits, no S3, no S4, no clicks, no rubs, no murmurs ABD:  Flat, positive bowel sounds normal in frequency in pitch, no bruits, no rebound, no guarding, no midline pulsatile mass, no hepatomegaly, no splenomegaly EXT:  2 plus pulses throughout, no edema, no cyanosis no clubbing SKIN:  No rashes no nodules NEURO:  Cranial nerves II through XII grossly intact, motor grossly intact throughout PSYCH:  Cognitively intact, oriented to person place and time   EKG:  EKG is ordered today. The ekg ordered 06/26/16 demonstrates sinus rhythm rate 63 bpm  LHC 05/21/16:  Ost LM to LM lesion, 40 %stenosed.  Ost Cx lesion, 90 %stenosed.  Ost Ramus lesion, 90 %stenosed. Ost 1st Mrg to 1st  Mrg lesion, 90 %stenosed.  Ost LAD to Prox LAD lesion, 40 %stenosed. Mid LAD lesion, 75 %stenosed.  Prox nondominant RCA lesion, 90 %stenosed.  Dist LAD lesion, 40 %stenosed.  The left ventricular systolic function is normal. LV end diastolic pressure is normal.  Recent Labs: 06/11/2016: ALT 19 06/14/2016: Magnesium 2.1 06/15/2016: BUN 12; Creatinine, Ser 1.03; Hemoglobin 12.1; Platelets 136; Potassium 3.6; Sodium 136   11/21/15 Sodium 138, potassium 3.7, BUN 22, creatinine 1.15 AST 16, ALT 15 Total cholesterol 183, triglycerides 145, HDL 47, LDL 107   Lipid Panel No results found for: CHOL, TRIG, HDL, CHOLHDL, VLDL, LDLCALC, LDLDIRECT    Wt Readings from Last 3 Encounters:  06/26/16 281 lb 9.6 oz (127.7 kg)  06/18/16 284 lb 11.2 oz (129.1  kg)  06/11/16 290 lb 8 oz (131.8 kg)      ASSESSMENT AND PLAN:  # CAD s/p CABG: Mr. Lurena NidaDonaldson is doing well post bypass.  He is eager to start cardiac rehab.  Continue aspirin, and metoprolol.  He can switch to aspirin 81mg  daily when amiodarone is discontinued.  # Post-operative atrial fibrillation: Mr. Lurena NidaDonaldson has not experienced any recurrent atrial fibrillation.  Continue amiodarone per Dr. Laneta SimmersBartle.  Suspect this will be stopped by 3 months post-op.  # Hypertension: Blood pressure is well-controlled on losartan/HCTZ and metoprolol.  No changes at this time.   # Hyperlipidemia: Stop simvastatin and start atorvastatin 40 mg daily.  He tolerated atorvastatin in the past but stopped it due to cost concerns before it became generic.  # Snoring: Ms. Lurena NidaDonaldson was told that he may have sleep apnea when in the hospital. He does endorse snoring and feeling tired throughout the day. We will refer him for sleep study.  # Obesity: Mr. Lurena NidaDonaldson was encouraged to keep up his lifestyle changes.   Current medicines are reviewed at length with the patient today.  The patient does not have concerns regarding medicines.  The following changes have  been made:   Labs/ tests ordered today include:   No orders of the defined types were placed in this encounter.    Disposition:   FU with Seanne Chirico C. Duke Salviaandolph, MD, [facility] in 6 months.   This note was written with the assistance of speech recognition software.  Please excuse any transcriptional errors.  Signed, Brayen Bunn C. Duke Salviaandolph, MD, [facility]  06/26/2016 2:56 PM    [facility]

## 2016-06-26 NOTE — Patient Instructions (Signed)
Medication Instructions:  STOP SIMVASTATIN   START ATORVASTATIN 40 MG DAILY   Labwork: NONE  Testing/Procedures: NONE  Follow-Up: Your physician wants you to follow-up in: 6 MONTH OV You will receive a reminder letter in the mail two months in advance. If you don't receive a letter, please call our office to schedule the follow-up appointment.  If you need a refill on your cardiac medications before your next appointment, please call your pharmacy.

## 2016-06-27 ENCOUNTER — Encounter (HOSPITAL_BASED_OUTPATIENT_CLINIC_OR_DEPARTMENT_OTHER): Payer: Medicare Other

## 2016-07-16 ENCOUNTER — Other Ambulatory Visit: Payer: Self-pay | Admitting: Surgery

## 2016-07-16 DIAGNOSIS — Z951 Presence of aortocoronary bypass graft: Secondary | ICD-10-CM

## 2016-07-17 ENCOUNTER — Ambulatory Visit (INDEPENDENT_AMBULATORY_CARE_PROVIDER_SITE_OTHER): Payer: Self-pay | Admitting: Surgery

## 2016-07-17 ENCOUNTER — Encounter: Payer: Self-pay | Admitting: Surgery

## 2016-07-17 ENCOUNTER — Ambulatory Visit
Admission: RE | Admit: 2016-07-17 | Discharge: 2016-07-17 | Disposition: A | Discharge status: Home / Self Care | Payer: BC Managed Care – PPO | Source: Ambulatory Visit | Attending: Surgery | Admitting: Surgery

## 2016-07-17 VITALS — BP 121/77 | HR 66 | Resp 20 | Ht 72.0 in | Wt 280.0 lb

## 2016-07-17 DIAGNOSIS — Z951 Presence of aortocoronary bypass graft: Secondary | ICD-10-CM

## 2016-07-17 DIAGNOSIS — I2510 Atherosclerotic heart disease of native coronary artery without angina pectoris: Secondary | ICD-10-CM

## 2016-07-21 ENCOUNTER — Encounter: Payer: Self-pay | Admitting: Surgery

## 2016-07-21 NOTE — Progress Notes (Signed)
      HPI: Patient returns for routine postoperative follow-up having undergone CABG x 4  on 06/13/2016. The patient's early postoperative recovery while in the hospital was notable for development of postop atrial fibrillation converted with amiodarone. Since hospital discharge the patient reports that he has been feeling well. He had had no irregular rhythm or palpitations. 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   Current Outpatient Prescriptions  Medication Sig Dispense Refill  . acetaminophen (TYLENOL) 325 MG tablet Take 2 tablets (650 mg total) by mouth every 6 (six) hours as needed for mild pain.    Marland Kitchen. amiodarone (PACERONE) 200 MG tablet Take 1 tablet (200 mg total) by mouth 2 (two) times daily. For 5 days then take Amiodarone 200 mg by mouth daily thereafter. 60 tablet 1  . aspirin EC 325 MG EC tablet Take 1 tablet (325 mg total) by mouth daily. 30 tablet 0  . atorvastatin (LIPITOR) 40 MG tablet Take 1 tablet (40 mg total) by mouth daily. 30 tablet 5  . finasteride (PROSCAR) 5 MG tablet Take 5 mg by mouth daily at 12 noon.   3  . losartan-hydrochlorothiazide (HYZAAR) 100-25 MG tablet Take 1 tablet by mouth every morning.   1  . metoprolol tartrate (LOPRESSOR) 25 MG tablet Take 1 tablet (25 mg total) by mouth 2 (two) times daily. 60 tablet 3  . traMADol (ULTRAM) 50 MG tablet Take 1 tablet (50 mg total) by mouth every 6 (six) hours as needed for moderate pain. 28 tablet 0   No current facility-administered medications for this visit.     Physical Exam: BP 121/77 (BP Location: Right Arm, Patient Position: Sitting, Cuff Size: Large)   Pulse 66   Resp 20   Ht 6' (1.829 m)   Wt 280 lb (127 kg)   SpO2 98%   BMI 37.97 kg/m  He looks well. Lung exam is clear. Cardiac exam shows a regular rate and rhythm with normal heart sounds. Chest incision is healing well and sternum is stable. The leg incisions are healing well and there is no peripheral edema.   Diagnostic  Tests:  CLINICAL DATA:  Post CABG  EXAM: CHEST  2 VIEW  COMPARISON:  06/15/2016  FINDINGS: Cardiomediastinal silhouette is stable. Status post CABG. No infiltrate or pulmonary edema. Mild elevation of the right hemidiaphragm again noted. Stable left basilar atelectasis or scarring. Mild degenerative change first mid and lower thoracic spine.  IMPRESSION: No active cardiopulmonary disease.  Status post CABG.   Electronically Signed   By: Natasha MeadLiviu  Pop M.D.   On: 07/17/2016 09:44   Impression:  Overall I think he is doing well. He is in sinus rhythm and I told his to discontinue the amiodarone when he runs out of this prescription which is in a couple weeks. I encouraged him to continue walking. He is planning to participate in cardiac rehab. I told him he could drive his car but should not lift anything heavier than 10 lbs for three months postop.   Plan:  He will stop the amiodarone. He will continue follow up with Dr. Duke Salviaandolph for his cardiology care.   Alleen BorneBryan K Jamelia Varano, MD [facility] [phone]

## 2016-09-26 ENCOUNTER — Telehealth: Payer: Self-pay | Admitting: Cardiovascular Disease

## 2016-09-26 NOTE — Telephone Encounter (Signed)
New message    Pt verbalized that he is requesting a call from the rn of Dr.Kenmore to talk about the medications that Dr.Harding prescribed

## 2016-09-26 NOTE — Telephone Encounter (Signed)
lmtcb

## 2016-09-26 NOTE — Telephone Encounter (Signed)
Spoke with pt he was just asking how he does refills. Questions answered. Pt states that nothing else is needed

## 2016-10-10 ENCOUNTER — Ambulatory Visit (INDEPENDENT_AMBULATORY_CARE_PROVIDER_SITE_OTHER): Payer: BC Managed Care – PPO | Admitting: Orthopedic Surgery

## 2016-10-10 ENCOUNTER — Encounter (INDEPENDENT_AMBULATORY_CARE_PROVIDER_SITE_OTHER): Payer: Self-pay | Admitting: Orthopedic Surgery

## 2016-10-10 DIAGNOSIS — Z96651 Presence of right artificial knee joint: Secondary | ICD-10-CM

## 2016-10-10 NOTE — Progress Notes (Signed)
Office Visit Note   Patient: Scott Johnson           Date of Birth: 10/08/1946           MRN: [id number] Visit Date: 10/10/2016 Requested by: Daisy Floroharles Alan Ross, MD [address] [place], [place] [address] PCP: Duane LopeAlan Ross, MD  Subjective: Chief Complaint  Patient presents with  . 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  He states his left knee occasionally twinges.  He is still teaching.              Review of Systems All systems reviewed are negative as they relate to the chief complaint within the history of present illness.  Patient denies  fevers or chills.    Assessment & Plan: Visit Diagnoses:  1. Presence of right artificial knee joint     Plan: Impression is very excellent functioning right total knee replacement.  He has full extension and about 125 of flexion.  He has no issues with the right knee.  He may need to get some injections in the left knee occasionally but at this point he is doing well.  Need for dental prophylaxis antibiotics discussed Follow-Up Instructions: Return if symptoms worsen or fail to improve.   Orders:  No orders of the defined types were placed in this encounter.  No orders of the defined types were placed in this encounter.     Procedures: No procedures performed   Clinical Data: No additional findings.  Objective: Vital Signs: There were no vitals taken for this visit.  Physical Exam   Constitutional: Patient appears well-developed HEENT:  Head: Normocephalic Eyes:EOM are normal Neck: Normal range of motion Cardiovascular: Normal rate Pulmonary/chest: Effort normal Neurologic: Patient is alert Skin: Skin is warm Psychiatric: Patient has normal mood and affect    Ortho Exam examination of the right knee demonstrates excellent range of motion no effusion excellent patella mobility  stable collaterals and 0 and 30.  Palpable pedal pulses.  No real swelling in the right leg.  Well-healed surgical incision is present.  Incisional site for saphenous vein harvest present.  Specialty Comments:  No specialty comments available.  Imaging: No results found.   PMFS History: Patient Active Problem List   Diagnosis Date Noted  . 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 Angina, class III (HCC) 05/15/2016  . Essential hypertension 05/15/2016  . Hyperlipidemia 05/15/2016  . Obesity (BMI 30-39.9) 05/15/2016  . Degenerative arthritis of right knee 09/26/2015   Past Medical History:  Diagnosis Date  . Arthritis   . Benign prostate hyperplasia    takes Proscar daily  . Colon polyp    benign  . Coronary artery disease   . Essential hypertension 05/15/2016  . History of bronchitis    many yrs ago  . Hyperlipidemia    takes Zocor daily   . Hypertension    takes Imdur,Metoprolol,and Losartan-HCTZ daily  . Joint pain     Family History  Problem Relation Age of Onset  . Heart failure Mother   . Hypertension Mother   . Heart failure Father   . Cancer Sister   . COPD Sister   . Heart disease Maternal Grandmother   . Heart failure Maternal Grandfather   . Heart attack Paternal Grandmother     Past Surgical History:  Procedure Laterality Date  . CARDIAC  CATHETERIZATION N/A 05/21/2016   Procedure: Left Heart Cath and Coronary Angiography;  Surgeon: Marykay Lexavid W Harding, MD;  [facility];  Service: Cardiovascular;  Laterality: N/A;  . COLONOSCOPY    . CORONARY ARTERY BYPASS GRAFT N/A 06/13/2016   Procedure: CORONARY ARTERY BYPASS GRAFTING (CABG)x 4 with endoscopic harvesting of right greater saphenous vein;  Surgeon: Alleen BorneBryan K Bartle, MD;  Location: [facility] OR;  Service: Open Heart Surgery;  Laterality: N/A;  . EYE SURGERY Bilateral 2015   Cataract removal  . TEE WITHOUT CARDIOVERSION N/A 06/13/2016   Procedure: TRANSESOPHAGEAL ECHOCARDIOGRAM (TEE);  Surgeon: Alleen BorneBryan K Bartle, MD;   [facility];  Service: Open Heart Surgery;  Laterality: N/A;  . 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/26/2015   Procedure: TOTAL KNEE ARTHROPLASTY, CEMENTED;  Surgeon: Cammy CopaScott Senaida Chilcote, MD;  Location: [facility] OR;  Service: Orthopedics;  Laterality: Right;   Social History   Occupational History  . Not on file.   Social History Main Topics  . Smoking status: Never Smoker  . Smokeless tobacco: Never Used  . Alcohol use No  . Drug use: No  . Sexual activity: Not on file

## 2016-10-24 ENCOUNTER — Ambulatory Visit (INDEPENDENT_AMBULATORY_CARE_PROVIDER_SITE_OTHER): Payer: Self-pay | Admitting: Orthopedic Surgery

## 2016-12-05 ENCOUNTER — Encounter (HOSPITAL_COMMUNITY): Payer: Self-pay | Admitting: Family Medicine

## 2016-12-05 ENCOUNTER — Telehealth (HOSPITAL_COMMUNITY): Payer: Self-pay | Admitting: Family Medicine

## 2016-12-05 NOTE — Telephone Encounter (Signed)
Mailed letter with Cardiac Rehab Program along with my chart message... KJ  °

## 2016-12-10 ENCOUNTER — Other Ambulatory Visit: Payer: Self-pay | Admitting: Cardiovascular Disease

## 2016-12-10 NOTE — Telephone Encounter (Signed)
Please review for refill. Thanks!  

## 2016-12-11 ENCOUNTER — Telehealth (HOSPITAL_COMMUNITY): Payer: Self-pay | Admitting: Family Medicine

## 2016-12-11 NOTE — Telephone Encounter (Signed)
Pulled pt's information through Passport to verify patients Insurance Benefits through  [employer]. No Co-Pay, Deductible $1250.00, nothing has been met, Out Of Pocket $4350.00, nothing has been met, pt's responsibility is $4350.00. Deductible and Co-insurance is included in the Out Of Pocket.  Reference # [id number]. Also sent patient my chart message.  KJ

## 2016-12-26 ENCOUNTER — Telehealth (HOSPITAL_COMMUNITY): Payer: Self-pay | Admitting: Family Medicine

## 2016-12-26 NOTE — Telephone Encounter (Signed)
Lft msg for pt to return call concerning Cardiac Rehab Program on getting scheduled for orientation. ... KJ

## 2016-12-31 ENCOUNTER — Telehealth (HOSPITAL_COMMUNITY): Payer: Self-pay | Admitting: Family Medicine

## 2016-12-31 NOTE — Telephone Encounter (Signed)
Lft msg for pt returning his call. Also sent msg through my chart concerning getting pt set up for the Cardiac Rehab Program... KJ

## 2017-01-03 ENCOUNTER — Encounter: Payer: Self-pay | Admitting: Cardiovascular Disease

## 2017-01-03 ENCOUNTER — Ambulatory Visit (INDEPENDENT_AMBULATORY_CARE_PROVIDER_SITE_OTHER): Payer: BC Managed Care – PPO | Admitting: Cardiovascular Disease

## 2017-01-03 VITALS — BP 124/76 | HR 62 | Ht 72.0 in | Wt 291.6 lb

## 2017-01-03 DIAGNOSIS — Z951 Presence of aortocoronary bypass graft: Secondary | ICD-10-CM

## 2017-01-03 DIAGNOSIS — I10 Essential (primary) hypertension: Secondary | ICD-10-CM | POA: Diagnosis not present

## 2017-01-03 DIAGNOSIS — R0683 Snoring: Secondary | ICD-10-CM

## 2017-01-03 DIAGNOSIS — E669 Obesity, unspecified: Secondary | ICD-10-CM | POA: Diagnosis not present

## 2017-01-03 DIAGNOSIS — E7800 Pure hypercholesterolemia, unspecified: Secondary | ICD-10-CM | POA: Diagnosis not present

## 2017-01-03 LAB — COMPREHENSIVE METABOLIC PANEL
ALT: 16 U/L (ref 9–46)
AST: 19 U/L (ref 10–35)
Albumin: 4 g/dL (ref 3.6–5.1)
Alkaline Phosphatase: 82 U/L (ref 40–115)
BILIRUBIN TOTAL: 2.1 mg/dL — AB (ref 0.2–1.2)
BUN: 18 mg/dL (ref 7–25)
CO2: 26 mmol/L (ref 20–31)
CREATININE: 1.17 mg/dL (ref 0.70–1.18)
Calcium: 9.3 mg/dL (ref 8.6–10.3)
Chloride: 103 mmol/L (ref 98–110)
Glucose, Bld: 90 mg/dL (ref 65–99)
POTASSIUM: 3.8 mmol/L (ref 3.5–5.3)
Sodium: 140 mmol/L (ref 135–146)
TOTAL PROTEIN: 7.1 g/dL (ref 6.1–8.1)

## 2017-01-03 LAB — LIPID PANEL
CHOL/HDL RATIO: 3 ratio (ref <5.0)
Cholesterol: 124 mg/dL (ref <200)
HDL: 42 mg/dL (ref >40)
LDL CALC: 65 mg/dL (ref <100)
TRIGLYCERIDES: 87 mg/dL (ref <150)
VLDL: 17 mg/dL (ref <30)

## 2017-01-03 NOTE — Patient Instructions (Signed)
Medication Instructions:  Your physician recommends that you continue on your current medications as directed. Please refer to the Current Medication list given to you today.  Labwork: LP/CMET AT [facility] ON THE FIRST FLOOR  Testing/Procedures: NONE  Follow-Up: Your physician wants you to follow-up in: 1 YEAR OV You will receive a reminder letter in the mail two months in advance. If you don't receive a letter, please call our office to schedule the follow-up appointment.  If you need a refill on your cardiac medications before your next appointment, please call your pharmacy.  

## 2017-01-03 NOTE — Progress Notes (Signed)
Cardiology Office Note   Date:  01/03/2017   ID:  Scott MurphyFranklin Johnson, DOB 05/24/1946, MRN [id number]  PCP:  Scott LopeAlan Ross, MD  Cardiologist:   Scott Siiffany [place], MD   Chief Complaint  Patient presents with  . Follow-up    6 months; Pt states no Sx.       History of Present Illness: Scott Johnson is a 71 y.o. 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  He was seen in clinic 05/15/16 and referred for LHC due to classic symptoms of angina.  Scott Johnson had a LHC 05/21/16 that revealed severe 3 vessel CAD.  He underwent 4 vessel CABG with Dr. Laneta SimmersBartle on 06/13/16.  He had post-operative atrial fibrillation managed with IV amiodarone and successfully converted to sinus rhythm.  This was dicontinued when he followed up with Dr. Laneta SimmersBartle on 09/2016.  Since then he has been feeling well and denies chest pain or shortness of breath.  He also hasn't noted any palpitations.  He occasionally has discomfort in his chest that he attributes to muscular skeletal pain from the surgery itself. He has not yet had a sleep study.   Past Medical History:  Diagnosis Date  . Arthritis   . Benign prostate hyperplasia    takes Proscar daily  . Colon polyp    benign  . Coronary artery disease   . Essential hypertension 05/15/2016  . History of bronchitis    many yrs ago  . Hyperlipidemia    takes Zocor daily   . Hypertension    takes Imdur,Metoprolol,and Losartan-HCTZ daily  . Joint pain     Past Surgical History:  Procedure Laterality Date  . CARDIAC CATHETERIZATION N/A 05/21/2016   Procedure: Left Heart Cath and Coronary Angiography;  Surgeon: Marykay Lexavid W Harding, MD;  [facility];  Service: Cardiovascular;  Laterality: N/A;  . COLONOSCOPY    . CORONARY ARTERY BYPASS GRAFT N/A 06/13/2016   Procedure: CORONARY ARTERY BYPASS GRAFTING (CABG)x 4 with endoscopic harvesting of right greater saphenous vein;  Surgeon: Alleen BorneBryan K Bartle, MD;  Location: [facility];  Service: Open Heart Surgery;  Laterality: N/A;  . EYE SURGERY Bilateral 2015   Cataract removal  . TEE WITHOUT CARDIOVERSION N/A 06/13/2016   Procedure: TRANSESOPHAGEAL ECHOCARDIOGRAM (TEE);  Surgeon: Alleen BorneBryan K Bartle, MD;  [facility];  Service: Open Heart Surgery;  Laterality: N/A;  . 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/26/2015   Procedure: TOTAL KNEE ARTHROPLASTY, CEMENTED;  Surgeon: Cammy CopaScott Gregory Dean, MD;  Location: [facility] OR;  Service: Orthopedics;  Laterality: Right;     Current Outpatient Prescriptions  Medication Sig Dispense Refill  . aspirin EC 81 MG tablet Take 81 mg by mouth daily.    Marland Kitchen. atorvastatin (LIPITOR) 40 MG tablet TAKE 1 TABLET(40 MG) BY MOUTH DAILY 30 tablet 0  . finasteride (PROSCAR) 5 MG tablet Take 5 mg by mouth daily at 12 noon.   3  . losartan-hydrochlorothiazide (HYZAAR) 100-25 MG tablet Take 1 tablet by mouth every morning.   1   No current facility-administered medications for this visit.     Allergies:   Penicillins    Social History:  The patient  reports that he has never smoked. He has never used smokeless tobacco. He reports that he does not drink alcohol or use  drugs.   Family History:  The patient's family history includes COPD in his sister; Cancer in his sister; Heart attack in his paternal grandmother; Heart disease in his maternal grandmother; Heart failure in his father, maternal grandfather, and mother; Hypertension in his mother.    ROS:  Please see the history of present illness.   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   All other systems are reviewed and negative.    PHYSICAL EXAM: VS:  BP 124/76   Pulse 62   Ht 6' (1.829 m)   Wt 132.3 kg (291 lb 9.6 oz)   BMI 39.55 kg/m  , BMI Body mass index is 39.55  kg/m. GENERAL:  Well appearing.  Obese HEENT:  Pupils equal round and reactive, fundi not visualized, oral mucosa unremarkable NECK:  No jugular venous distention, waveform within normal limits, carotid upstroke brisk and symmetric, no bruits, no thyromegaly CHEST: Midline surgical incision healing well.  No erythema or drainage. LYMPHATICS:  No cervical adenopathy LUNGS:  Clear to auscultation bilaterally HEART:  RRR.  PMI not displaced or sustained,S1 and S2 within normal limits, no S3, no S4, no clicks, no rubs, no murmurs ABD:  Flat, positive bowel sounds normal in frequency in pitch, no bruits, no rebound, no guarding, no midline pulsatile mass, no hepatomegaly, no splenomegaly EXT:  2 plus pulses throughout, no edema, no cyanosis no clubbing SKIN:  No rashes no nodules NEURO:  Cranial nerves II through XII grossly intact, motor grossly intact throughout PSYCH:  Cognitively intact, oriented to person place and time   EKG:  EKG is ordered today. The ekg ordered 06/26/16 demonstrates sinus rhythm rate 63 bpm 01/03/17: Sinus rhythm.  Rate 62 bpm.   LHC 05/21/16:  Ost LM to LM lesion, 40 %stenosed.  Ost Cx lesion, 90 %stenosed.  Ost Ramus lesion, 90 %stenosed. Ost 1st Mrg to 1st Mrg lesion, 90 %stenosed.  Ost LAD to Prox LAD lesion, 40 %stenosed. Mid LAD lesion, 75 %stenosed.  Prox nondominant RCA lesion, 90 %stenosed.  Dist LAD lesion, 40 %stenosed.  The left ventricular systolic function is normal. LV end diastolic pressure is normal.  Recent Labs: 06/11/2016: ALT 19 06/14/2016: Magnesium 2.1 06/15/2016: BUN 12; Creatinine, Ser 1.03; Hemoglobin 12.1; Platelets 136; Potassium 3.6; Sodium 136   11/21/15 Sodium 138, potassium 3.7, BUN 22, creatinine 1.15 AST 16, ALT 15 Total cholesterol 183, triglycerides 145, HDL 47, LDL 107   Lipid Panel No results found for: CHOL, TRIG, HDL, CHOLHDL, VLDL, LDLCALC, LDLDIRECT    Wt Readings from Last 3 Encounters:  01/03/17 132.3 kg (291  lb 9.6 oz)  07/17/16 127 kg (280 lb)  06/26/16 127.7 kg (281 lb 9.6 oz)      ASSESSMENT AND PLAN:  # CAD s/p CABG: Scott Johnson is doing well.  We talked about the importance of diet and exercise.  Continue aspirin and atorvastatin. He is not on a beta blocker due to bradycardia.  # Post-operative atrial fibrillation: No recurrent episodes since stopping amiodarone.  # Hypertension: Blood pressure is well-controlled on losartan/HCTZ.  No changes at this time.   # Hyperlipidemia: Continue atorvastatin.  We will check lipids and a CMP today.   # Snoring: Patient will schedule a sleep study.   # Obesity: Scott Johnson was encouraged to start back walking and continued to improve his diet.   Current medicines are reviewed at length with the patient today.  The patient does not have concerns regarding medicines.  The following changes have been  made:  none  Labs/ tests ordered today include:   Orders Placed This Encounter  Procedures  . Comprehensive metabolic panel  . Lipid panel  . EKG 12-Lead     Disposition:   FU with Scott Spiewak C. Duke Salvia, MD, [facility] in 1 year   This note was written with the assistance of speech recognition software.  Please excuse any transcriptional errors.  Signed, Margeaux Swantek C. Duke Salvia, MD, [facility]  01/03/2017 2:25 PM    [facility]

## 2017-01-10 ENCOUNTER — Telehealth: Payer: Self-pay | Admitting: Cardiovascular Disease

## 2017-01-10 MED ORDER — ATORVASTATIN CALCIUM 40 MG PO TABS: 40.0000 mg | ORAL_TABLET | Freq: Every day | ORAL | 3 refills | Status: DC

## 2017-01-10 NOTE — Telephone Encounter (Signed)
Normal labwork discussed, patient voiced thanks. Lipitor refilled at his request as well.

## 2017-01-10 NOTE — Telephone Encounter (Signed)
New message   Pt called about lab results

## 2017-03-21 ENCOUNTER — Telehealth (INDEPENDENT_AMBULATORY_CARE_PROVIDER_SITE_OTHER): Payer: Self-pay

## 2017-03-21 MED ORDER — CLINDAMYCIN HCL 150 MG PO CAPS: ORAL_CAPSULE | ORAL | 2 refills | Status: DC

## 2017-03-21 NOTE — Telephone Encounter (Signed)
IC patient and advised LMVM, yes he needs abx, Rx sent to his pharm

## 2017-03-21 NOTE — Telephone Encounter (Signed)
Patient would like to know if he needs to have an antibiotic before having dental work done.  CB# is [phone].  Please Advise. Thank You.

## 2017-03-21 NOTE — Addendum Note (Signed)
Addended byCherre Huger: Idalis Hoelting on: 03/21/2017 10:41 AM   Modules accepted: Orders

## 2017-12-30 ENCOUNTER — Encounter: Payer: Self-pay | Admitting: Cardiovascular Disease

## 2017-12-30 ENCOUNTER — Ambulatory Visit: Payer: BC Managed Care – PPO | Admitting: Cardiovascular Disease

## 2017-12-30 VITALS — BP 102/62 | HR 65 | Ht 73.0 in | Wt 295.0 lb

## 2017-12-30 DIAGNOSIS — I10 Essential (primary) hypertension: Secondary | ICD-10-CM | POA: Diagnosis not present

## 2017-12-30 DIAGNOSIS — Z5181 Encounter for therapeutic drug level monitoring: Secondary | ICD-10-CM | POA: Diagnosis not present

## 2017-12-30 DIAGNOSIS — E7800 Pure hypercholesterolemia, unspecified: Secondary | ICD-10-CM

## 2017-12-30 DIAGNOSIS — E669 Obesity, unspecified: Secondary | ICD-10-CM

## 2017-12-30 DIAGNOSIS — Z951 Presence of aortocoronary bypass graft: Secondary | ICD-10-CM | POA: Diagnosis not present

## 2017-12-30 NOTE — Patient Instructions (Addendum)
Medication Instructions:  Your physician recommends that you continue on your current medications as directed. Please refer to the Current Medication list given to you today.  Labwork: FASTING LP/CMET IN 3 MONTHS   Testing/Procedures: NONE  Follow-Up: Your physician wants you to follow-up in: 1 YEAR OV You will receive a reminder letter in the mail two months in advance. If you don't receive a letter, please call our office to schedule the follow-up appointment.  Any Other Special Instructions Will Be Listed Below (If Applicable). WORK ON LIMITING YOUR FRIED/GREASY FOODS, FATTY FOODS, RED MEAT, AND CHEESES

## 2017-12-30 NOTE — Progress Notes (Signed)
Cardiology Office Note   Date:  12/30/2017   ID:  Scott Johnson, DOB 04-03-1946, MRN [id number]  PCP:  Daisy Floro, MD  Cardiologist:   Chilton Si, MD   Chief Complaint  Patient presents with  . Follow-up    1 year.      History of Present Illness: Scott Johnson is a 72 y.o. 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  He was seen in clinic 05/15/16 and referred for LHC due to classic symptoms of angina.  Scott Johnson had a LHC 05/21/16 that revealed severe 3 vessel CAD.  He underwent 4 vessel CABG with Dr. Laneta Simmers on 06/13/16.  He had post-operative atrial fibrillation managed with IV amiodarone and successfully converted to sinus rhythm.  This was dicontinued when he followed up with Dr. Laneta Simmers on 09/2016.  Since then he has been feeling well and denies chest pain or shortness of breath.  He also hasn't noted any palpitations.  He occasionally has discomfort in his chest that he attributes to muscular skeletal pain from the surgery itself.  Scott Johnson has been doing well since his last appointment.  He has no chest pain or shortness of breath.  He has not been getting much exercise lately.  The most exercise he gets is walking across campus.  He does not like to exercise.  Once the weather starts improving he plans to start working outside in his yard more.  He has not experienced any lower extremity edema, orthopnea, or PND.  A sleep study was ordered but not performed.  He does not think that he needs it.  He does snore but has no apneic episodes to his knowledge.  He wakes up feeling tired but he attributes this more to have any get up in the middle the night to urinate then apnea.  He does not endorse daytime somnolence.   Past Medical History:  Diagnosis Date  .  Arthritis   . Benign prostate hyperplasia    takes Proscar daily  . Colon polyp    benign  . Coronary artery disease   . Essential hypertension 05/15/2016  . History of bronchitis    many yrs ago  . Hyperlipidemia    takes Zocor daily   . Hypertension    takes Imdur,Metoprolol,and Losartan-HCTZ daily  . Joint pain     Past Surgical History:  Procedure Laterality Date  . CARDIAC CATHETERIZATION N/A 05/21/2016   Procedure: Left Heart Cath and Coronary Angiography;  Surgeon: Marykay Lex, MD;  Location: [facility] INVASIVE CV LAB;  Service: Cardiovascular;  Laterality: N/A;  . COLONOSCOPY    . CORONARY ARTERY BYPASS GRAFT N/A 06/13/2016   Procedure: CORONARY ARTERY BYPASS GRAFTING (CABG)x 4 with endoscopic harvesting of right greater saphenous vein;  Surgeon: Alleen Borne, MD;  Location: [facility] OR;  Service: Open Heart Surgery;  Laterality: N/A;  . EYE SURGERY Bilateral 2015   Cataract removal  . TEE WITHOUT CARDIOVERSION N/A 06/13/2016   Procedure: TRANSESOPHAGEAL ECHOCARDIOGRAM (TEE);  Surgeon: Alleen Borne, MD;  Location: [facility] OR;  Service: Open Heart Surgery;  Laterality: N/A;  . 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/26/2015   Procedure: TOTAL KNEE ARTHROPLASTY, CEMENTED;  Surgeon: Cammy Copa, MD;  Location: [facility] OR;  Service: Orthopedics;  Laterality: Right;  Current Outpatient Medications  Medication Sig Dispense Refill  . aspirin EC 81 MG tablet Take 81 mg by mouth daily.    Marland Kitchen atorvastatin (LIPITOR) 40 MG tablet Take 1 tablet (40 mg total) by mouth daily at 6 PM. 90 tablet 3  . clindamycin (CLEOCIN) 150 MG capsule Take 4 capsules 1 hour prior to dental cleaning or dental work. 12 capsule 2  . finasteride (PROSCAR) 5 MG tablet Take 5 mg by mouth daily at 12 noon.   3  . losartan-hydrochlorothiazide (HYZAAR) 100-25 MG tablet Take 1 tablet by mouth every morning.   1   No current facility-administered medications for this visit.      Allergies:   Penicillins    Social History:  The patient  reports that  has never smoked. he has never used smokeless tobacco. He reports that he does not drink alcohol or use drugs.   Family History:  The patient's family history includes COPD in his sister; Cancer in his sister; Heart attack in his paternal grandmother; Heart disease in his maternal grandmother; Heart failure in his father, maternal grandfather, and mother; Hypertension in his mother.    ROS:  Please see the history of present illness.   Otherwise, review of systems are positive for none.   All other systems are reviewed and negative.    PHYSICAL EXAM: VS:  BP 102/62   Pulse 65   Ht 6\' 1"  (1.854 m)   Wt 295 lb (133.8 kg)   BMI 38.92 kg/m  , BMI Body mass index is 38.92 kg/m. GENERAL:  Well appearing.  No acute distress.  HEENT: Pupils equal round and reactive, fundi not visualized, oral mucosa unremarkable NECK:  No jugular venous distention, waveform within normal limits, carotid upstroke brisk and symmetric, no bruits LUNGS:  Clear to auscultation bilaterally HEART:  RRR.  PMI not displaced or sustained,S1 and S2 within normal limits, no S3, no S4, no clicks, no rubs, no murmurs ABD:  Flat, positive bowel sounds normal in frequency in pitch, no bruits, no rebound, no guarding, no midline pulsatile mass, no hepatomegaly, no splenomegaly EXT:  2 plus pulses throughout, no edema, no cyanosis no clubbing SKIN:  No rashes no nodules NEURO:  Cranial nerves II through XII grossly intact, motor grossly intact throughout PSYCH:  Cognitively intact, oriented to person place and time   EKG:  EKG is ordered today. The ekg ordered 06/26/16 demonstrates sinus rhythm rate 63 bpm 01/03/17: Sinus rhythm.  Rate 62 bpm. 12/30/17: Sinus rhythm.  Rate 65 bpm.  LAFB.     LHC 05/21/16:  Ost LM to LM lesion, 40 %stenosed.  Ost Cx lesion, 90 %stenosed.  Ost Ramus lesion, 90 %stenosed. Ost 1st Mrg to 1st Mrg lesion, 90  %stenosed.  Ost LAD to Prox LAD lesion, 40 %stenosed. Mid LAD lesion, 75 %stenosed.  Prox nondominant RCA lesion, 90 %stenosed.  Dist LAD lesion, 40 %stenosed.  The left ventricular systolic function is normal. LV end diastolic pressure is normal.  Recent Labs: 01/03/2017: ALT 16; BUN 18; Creat 1.17; Potassium 3.8; Sodium 140   11/21/15 Sodium 138, potassium 3.7, BUN 22, creatinine 1.15 AST 16, ALT 15 Total cholesterol 183, triglycerides 145, HDL 47, LDL 107  12/03/17: Total cholesterol 146, triglycerides 97, HDL 42, LDL 85 WBC 9.6, hemoglobin 15.2, hematocrit 44.2, platelets 286 Sodium 142, potassium 3.6, BUN 17, creatinine 1.27 AST 17, ALT 70   Lipid Panel    Component Value Date/Time   CHOL 124 01/03/2017 1437  TRIG 87 01/03/2017 1437   HDL 42 01/03/2017 1437   CHOLHDL 3.0 01/03/2017 1437   VLDL 17 01/03/2017 1437   LDLCALC 65 01/03/2017 1437      Wt Readings from Last 3 Encounters:  12/30/17 295 lb (133.8 kg)  01/03/17 291 lb 9.6 oz (132.3 kg)  07/17/16 280 lb (127 kg)      ASSESSMENT AND PLAN:  # CAD s/p CABG: Scott Johnson is doing well.  Stressed the importance of diet and exercise. Continue aspirin and atorvastatin. He is not on a beta blocker due to bradycardia.  # Post-operative atrial fibrillation: No recurrent episodes since stopping amiodarone.  # Hypertension: Blood pressure is well-controlled on losartan/HCTZ.  No changes at this time.   # Hyperlipidemia: Continue atorvastatin.  LDL 85.  Goal is <70.  He wants to work on diet and exercise before increasing his atorvastatin.If he is not at goal we will increase atorvastatin to 80 mg.   # Snoring: Patient is not interested in getting a sleep study.  # Obesity: Scott Johnson was encouraged to start back walking and continued to improve his diet.   Current medicines are reviewed at length with the patient today.  The patient does not have concerns regarding medicines.  The following changes have  been made:  none  Labs/ tests ordered today include:   Orders Placed This Encounter  Procedures  . Comprehensive metabolic panel  . Lipid panel  . EKG 12-Lead     Disposition:   FU with Kelen Laura C. Duke Salviaandolph, MD, [facility] in 1 year    Signed, Bianca Raneri C. Duke Salviaandolph, MD, [facility]  12/30/2017 5:34 PM    [facility]

## 2018-01-20 ENCOUNTER — Other Ambulatory Visit: Payer: Self-pay | Admitting: Cardiovascular Disease

## 2018-01-21 NOTE — Telephone Encounter (Signed)
Please review for refill, Thanks !  

## 2018-04-03 LAB — COMPREHENSIVE METABOLIC PANEL
A/G RATIO: 1.6 (ref 1.2–2.2)
ALT: 15 IU/L (ref 0–44)
AST: 19 IU/L (ref 0–40)
Albumin: 4.1 g/dL (ref 3.5–4.8)
Alkaline Phosphatase: 99 IU/L (ref 39–117)
BUN/Creatinine Ratio: 15 (ref 10–24)
BUN: 18 mg/dL (ref 8–27)
Bilirubin Total: 1.7 mg/dL — ABNORMAL HIGH (ref 0.0–1.2)
CALCIUM: 9.3 mg/dL (ref 8.6–10.2)
CO2: 22 mmol/L (ref 20–29)
Chloride: 100 mmol/L (ref 96–106)
Creatinine, Ser: 1.24 mg/dL (ref 0.76–1.27)
GFR, EST AFRICAN AMERICAN: 67 mL/min/{1.73_m2} (ref >59)
GFR, EST NON AFRICAN AMERICAN: 58 mL/min/{1.73_m2} — AB (ref >59)
GLOBULIN, TOTAL: 2.6 g/dL (ref 1.5–4.5)
Glucose: 106 mg/dL — ABNORMAL HIGH (ref 65–99)
POTASSIUM: 3.8 mmol/L (ref 3.5–5.2)
SODIUM: 138 mmol/L (ref 134–144)
TOTAL PROTEIN: 6.7 g/dL (ref 6.0–8.5)

## 2018-04-03 LAB — LIPID PANEL
CHOL/HDL RATIO: 3.4 ratio (ref 0.0–5.0)
Cholesterol, Total: 135 mg/dL (ref 100–199)
HDL: 40 mg/dL (ref >39)
LDL Calculated: 70 mg/dL (ref 0–99)
Triglycerides: 127 mg/dL (ref 0–149)
VLDL Cholesterol Cal: 25 mg/dL (ref 5–40)

## 2018-07-10 IMAGING — CR DG CHEST 2V
2 series · 2 of 2 positions shown · non-contrast
Comparison: PA and lateral chest x-ray September 12, 2015

CLINICAL DATA: Preoperative examination prior to CABG, no current
chest complaints.

EXAM:
CHEST  2 VIEW

[w chest pa]
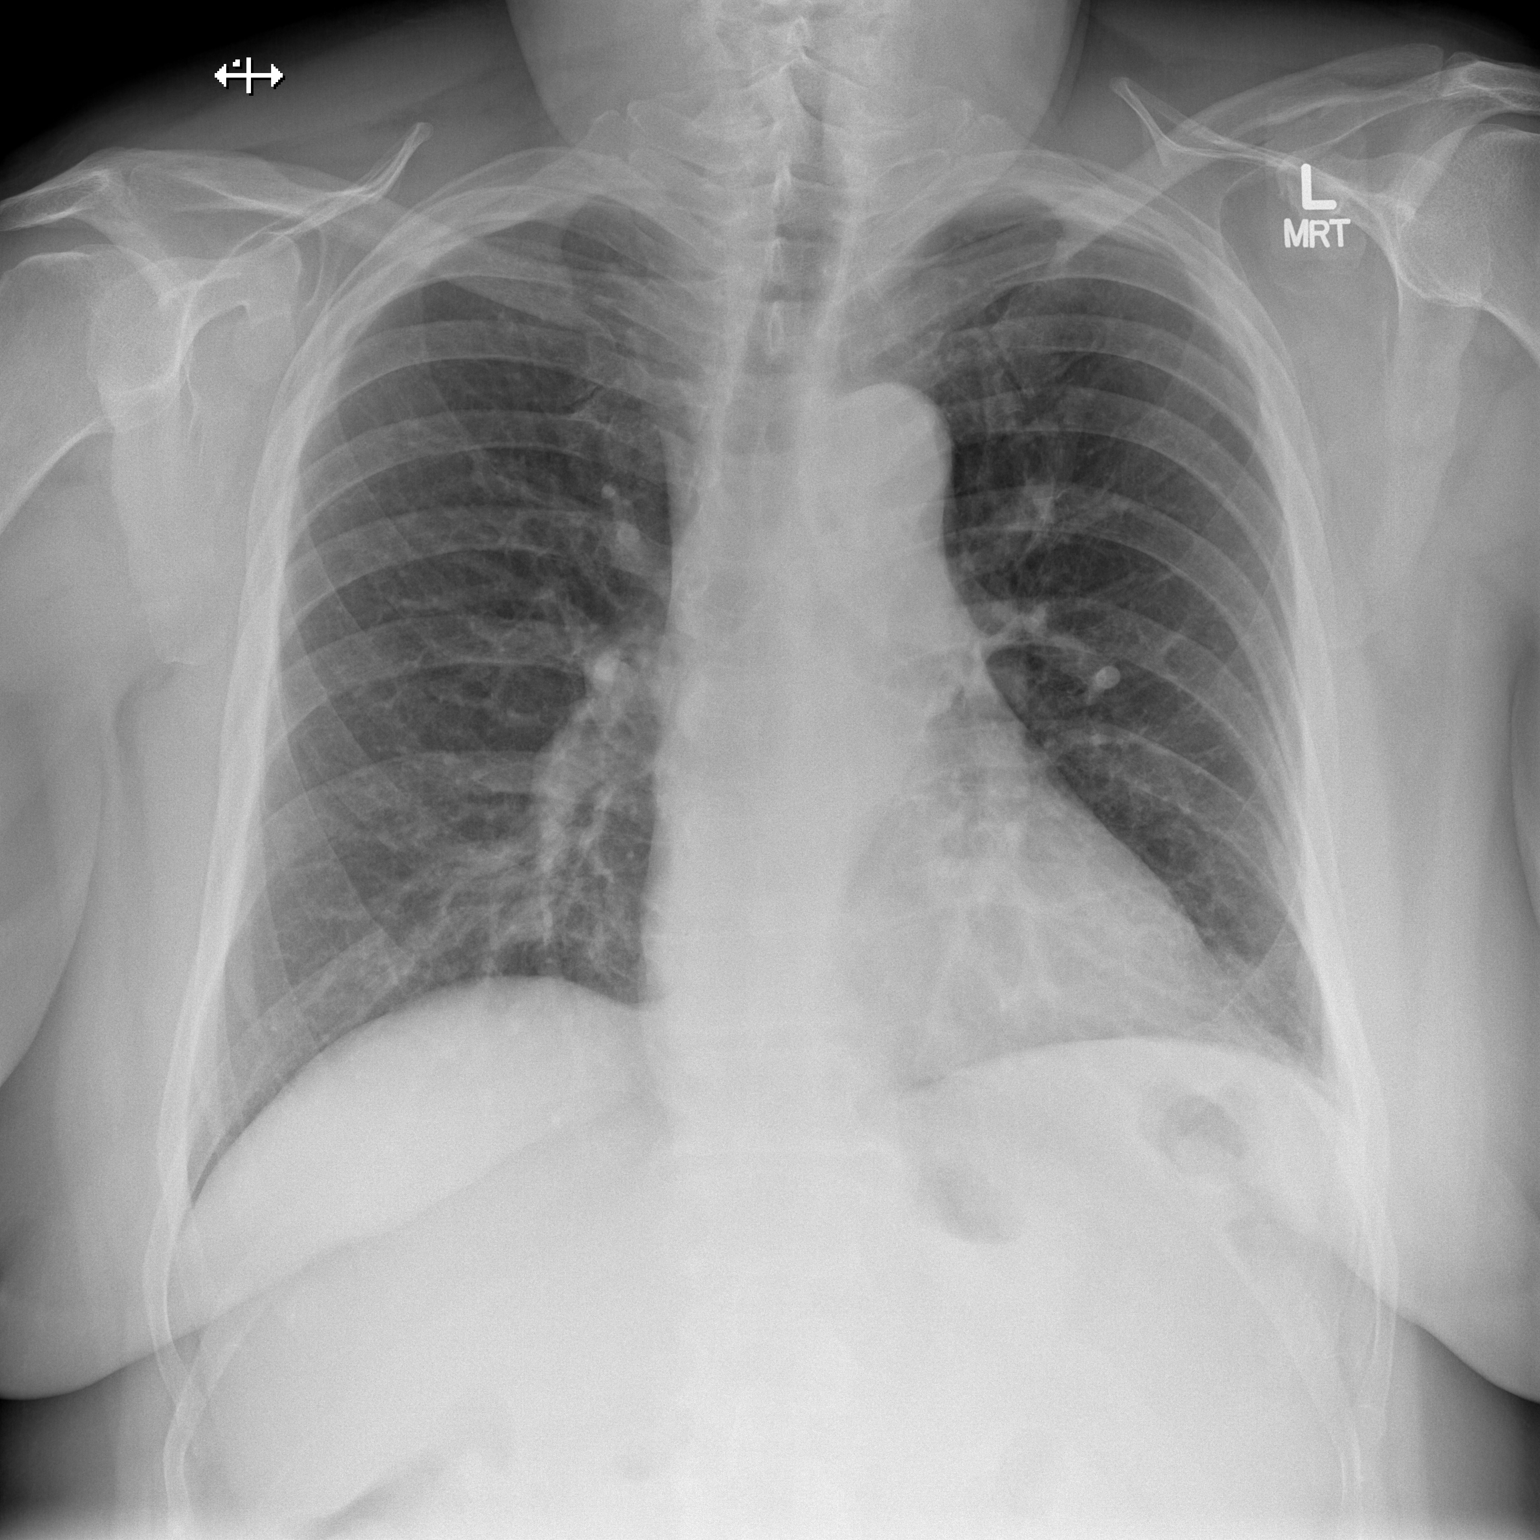

[w chest lat]
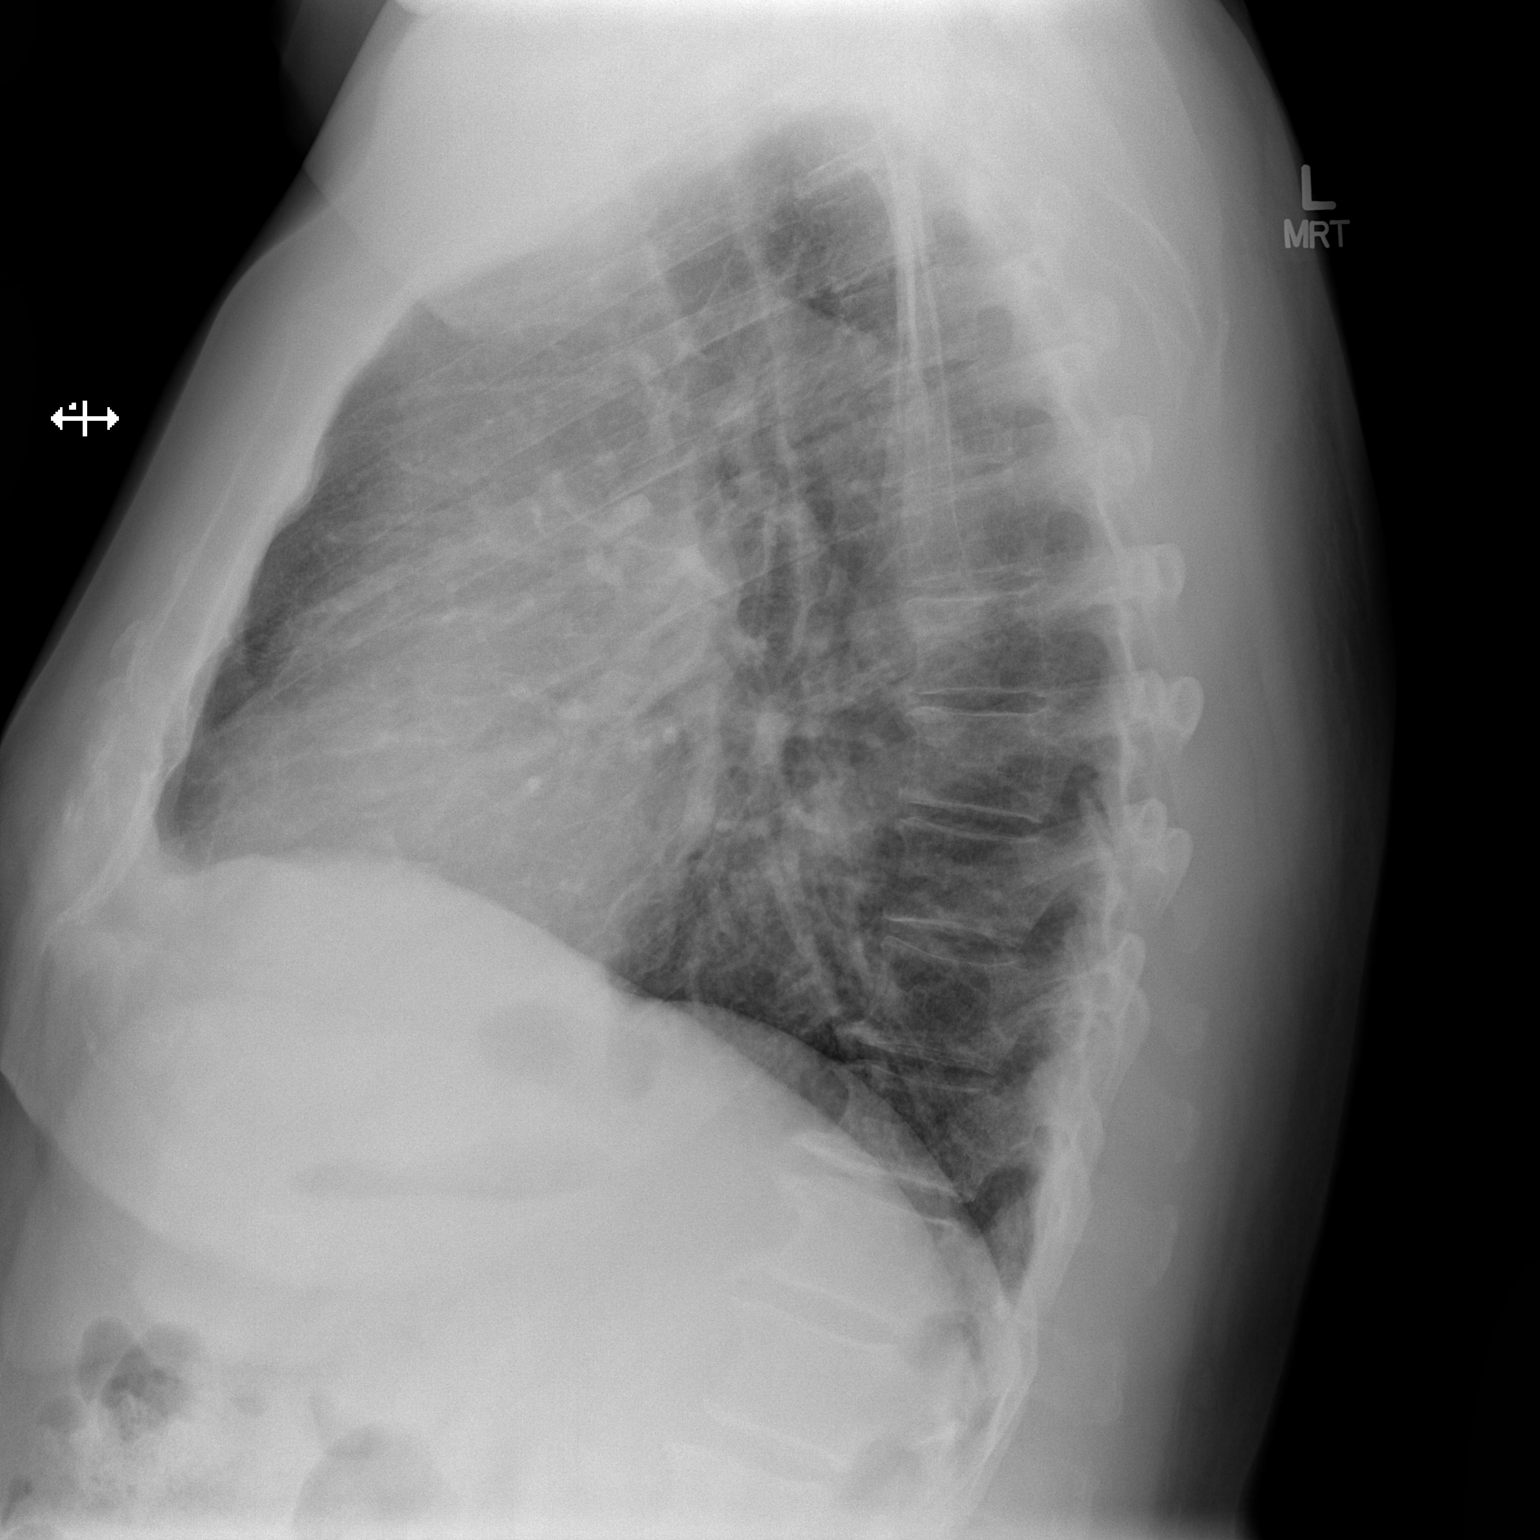

[2 of 2 positions shown; findings below may reference images not displayed]

FINDINGS: The lungs are adequately inflated. There is no focal infiltrate.
There is no pleural effusion. The heart and pulmonary vascularity
are normal. The mediastinum is normal in width. The bony thorax
exhibits no acute abnormality. There is calcification in the wall of
the aortic arch.
IMPRESSION: There is no active cardiopulmonary disease.

Aortic atherosclerosis.

## 2018-07-12 IMAGING — DX DG CHEST 1V PORT
1 series · 1 of 1 positions shown · non-contrast
Comparison: 06/11/2016

CLINICAL DATA: Status post CABG

EXAM:
PORTABLE CHEST 1 VIEW

[chest ap]
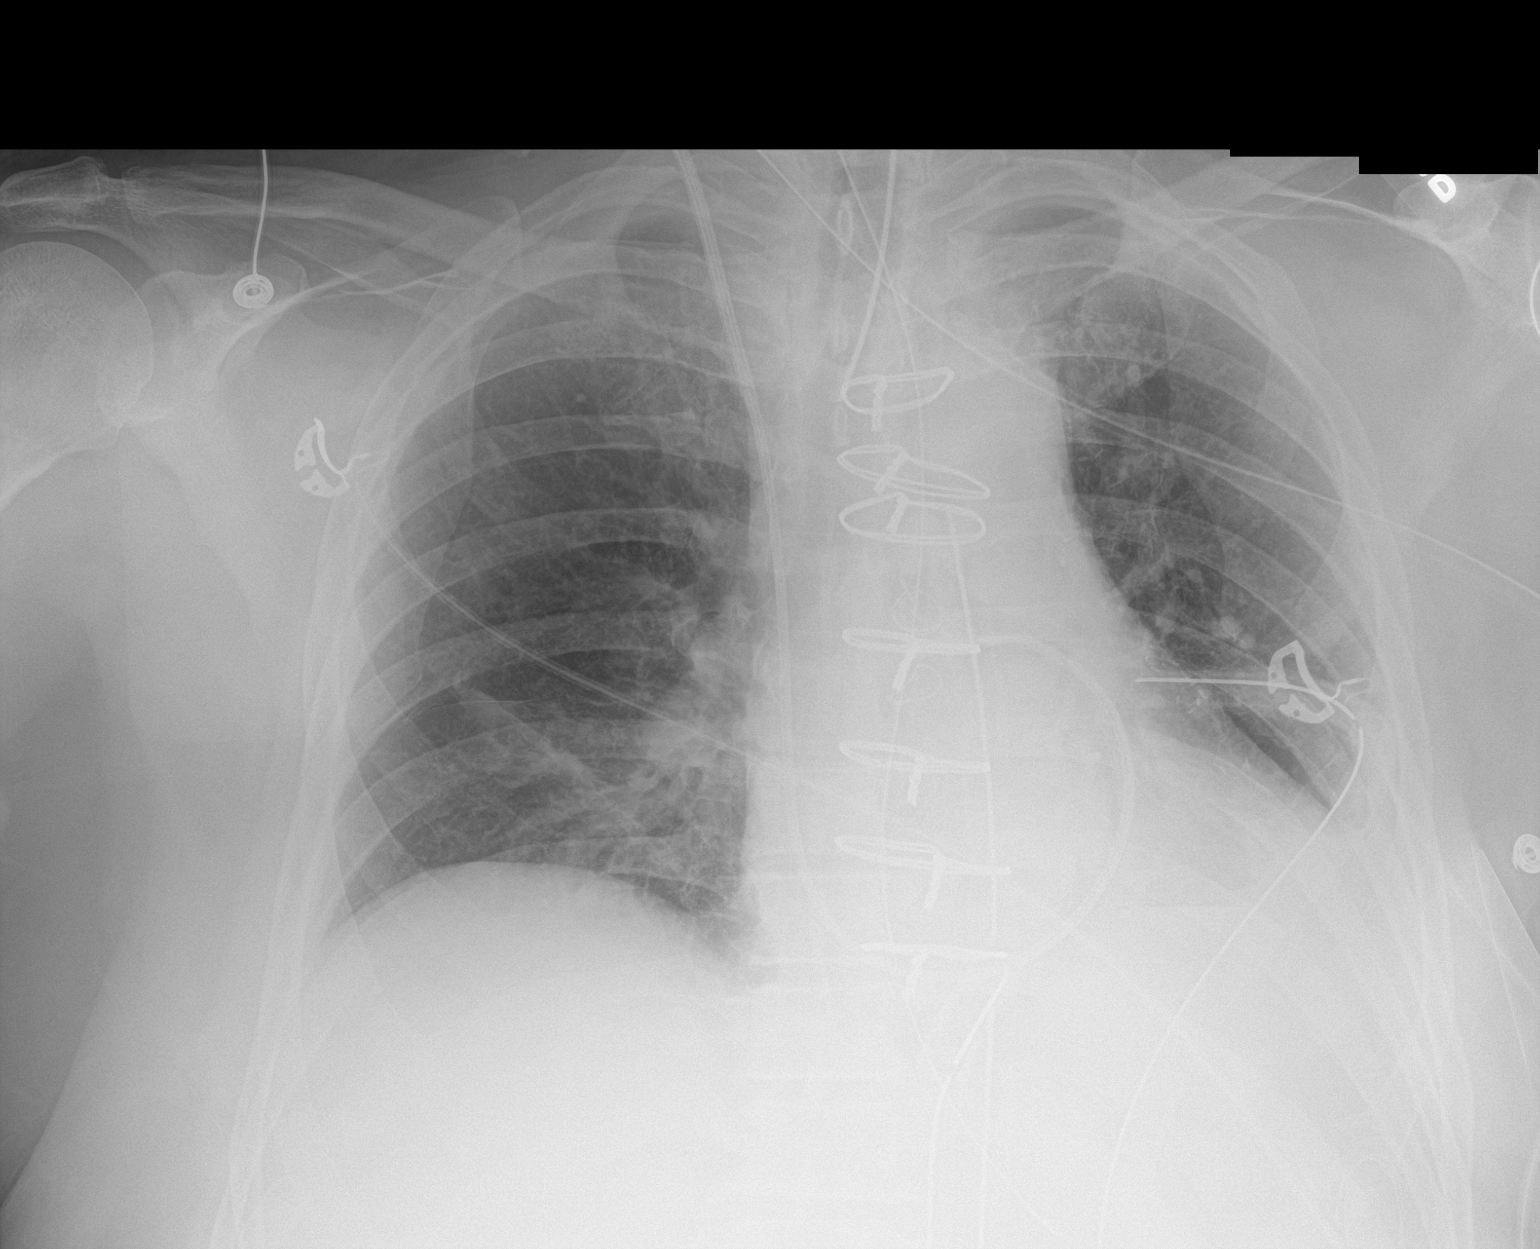

[1 of 1 positions shown; findings below may reference images not displayed]

FINDINGS: Borderline cardiomegaly. Small left pleural effusion with left
basilar atelectasis or infiltrate. Left chest tube in place. No
pneumothorax. Endotracheal tube in place with tip 3.8 cm above the
carina. NG tube in place. Right IJ Swan-Ganz catheter with tip in
the region of left main pulmonary artery. No pulmonary edema.
IMPRESSION: Status post CABG. Support apparatus is in place. Small left pleural
effusion left basilar atelectasis or infiltrate. No pneumothorax.
Left chest tube in place.

## 2018-12-31 ENCOUNTER — Encounter: Payer: Self-pay | Admitting: Cardiovascular Disease

## 2018-12-31 ENCOUNTER — Ambulatory Visit: Payer: BC Managed Care – PPO | Admitting: Cardiovascular Disease

## 2018-12-31 ENCOUNTER — Other Ambulatory Visit: Payer: Self-pay

## 2018-12-31 VITALS — BP 130/68 | HR 54 | Ht 73.0 in | Wt 289.6 lb

## 2018-12-31 DIAGNOSIS — E7800 Pure hypercholesterolemia, unspecified: Secondary | ICD-10-CM

## 2018-12-31 DIAGNOSIS — Z951 Presence of aortocoronary bypass graft: Secondary | ICD-10-CM | POA: Diagnosis not present

## 2018-12-31 DIAGNOSIS — I10 Essential (primary) hypertension: Secondary | ICD-10-CM

## 2018-12-31 DIAGNOSIS — I480 Paroxysmal atrial fibrillation: Secondary | ICD-10-CM

## 2018-12-31 NOTE — Progress Notes (Signed)
Cardiology Office Note   Date:  01/01/2019   ID:  Scott Johnson, DOB 13-May-1946, MRN [id number]  PCP:  Daisy Floro, MD  Cardiologist:   Chilton Si, MD   No chief complaint on file.    History of Present Illness: Scott Johnson is a 73 y.o. 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  He was seen in clinic 05/15/16 and referred for LHC due to classic symptoms of angina.  Mr. Montan had a LHC 05/21/16 that revealed severe 3 vessel CAD.  He underwent 4 vessel CABG with Dr. Laneta Simmers on 06/13/16.  He had post-operative atrial fibrillation managed with IV amiodarone and successfully converted to sinus rhythm.  This was dicontinued when he followed up with Dr. Laneta Simmers on 09/2016.    Since his last appointment Mr. Mcelhenney has been doing well.  He has not experienced any chest pain or shortness of breath.  He has no lower extremity edema, orthopnea or PND.  His only complaint is occasional numbness in his hands that usually occurs when lying in bed.  He has not been getting any exercise.      Past Medical History:  Diagnosis Date  . Arthritis   . Benign prostate hyperplasia    takes Proscar daily  . Colon polyp    benign  . Coronary artery disease   . Essential hypertension 05/15/2016  . History of bronchitis    many yrs ago  . Hyperlipidemia    takes Zocor daily   . Hypertension    takes Imdur,Metoprolol,and Losartan-HCTZ daily  . Joint pain     Past Surgical History:  Procedure Laterality Date  . CARDIAC CATHETERIZATION N/A 05/21/2016   Procedure: Left Heart Cath and Coronary Angiography;  Surgeon: Marykay Lex, MD;  Location: [facility] INVASIVE CV LAB;  Service: Cardiovascular;  Laterality: N/A;  . COLONOSCOPY    . CORONARY ARTERY BYPASS GRAFT N/A 06/13/2016   Procedure:  CORONARY ARTERY BYPASS GRAFTING (CABG)x 4 with endoscopic harvesting of right greater saphenous vein;  Surgeon: Alleen Borne, MD;  Location: [facility] OR;  Service: Open Heart Surgery;  Laterality: N/A;  . EYE SURGERY Bilateral 2015   Cataract removal  . TEE WITHOUT CARDIOVERSION N/A 06/13/2016   Procedure: TRANSESOPHAGEAL ECHOCARDIOGRAM (TEE);  Surgeon: Alleen Borne, MD;  Location: [facility] OR;  Service: Open Heart Surgery;  Laterality: N/A;  . 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/26/2015   Procedure: TOTAL KNEE ARTHROPLASTY, CEMENTED;  Surgeon: Cammy Copa, MD;  Location: [facility] OR;  Service: Orthopedics;  Laterality: Right;     Current Outpatient Medications  Medication Sig Dispense Refill  . aspirin EC 81 MG tablet Take 81 mg by mouth daily.    Marland Kitchen atorvastatin (LIPITOR) 40 MG tablet TAKE 1 TABLET(40 MG) BY MOUTH DAILY AT 6 PM 90 tablet 3  . clindamycin (CLEOCIN) 150 MG capsule Take 4 capsules 1 hour prior to dental cleaning or dental work. 12 capsule 2  . finasteride (PROSCAR) 5 MG tablet Take 5 mg by mouth daily at 12 noon.   3  . hydrochlorothiazide (HYDRODIURIL) 25 MG tablet Take 25 mg by mouth daily.    Marland Kitchen losartan (COZAAR) 100 MG tablet Take 100 mg by mouth daily.    Marland Kitchen losartan-hydrochlorothiazide (HYZAAR) 100-25 MG tablet Take 1 tablet by  mouth every morning.   1   No current facility-administered medications for this visit.     Allergies:   Penicillins    Social History:  The patient  reports that he has never smoked. He has never used smokeless tobacco. He reports that he does not drink alcohol or use drugs.   Family History:  The patient's family history includes COPD in his sister; Cancer in his sister; Heart attack in his paternal grandmother; Heart disease in his maternal grandmother; Heart failure in his father, maternal grandfather, and mother; Hypertension in his mother.    ROS:  Please see the history of present illness.   Otherwise,  review of systems are positive for none.   All other systems are reviewed and negative.    PHYSICAL EXAM: VS:  BP 130/68   Pulse (!) 54   Ht  (1.854 m)   Wt 289 lb 9.6 oz (131.4 kg)   BMI 38.21 kg/m  , BMI Body mass index is 38.21 kg/m. GENERAL:  Well appearing HEENT: Pupils equal round and reactive, fundi not visualized, oral mucosa unremarkable NECK:  No jugular venous distention, waveform within normal limits, carotid upstroke brisk and symmetric, no bruits LUNGS:  Clear to auscultation bilaterally HEART:  RRR.  PMI not displaced or sustained,S1 and S2 within normal limits, no S3, no S4, no clicks, no rubs, no murmurs ABD:  Flat, positive bowel sounds normal in frequency in pitch, no bruits, no rebound, no guarding, no midline pulsatile mass, no hepatomegaly, no splenomegaly EXT:  2 plus pulses throughout, no edema, no cyanosis no clubbing SKIN:  No rashes no nodules NEURO:  Cranial nerves II through XII grossly intact, motor grossly intact throughout PSYCH:  Cognitively intact, oriented to person place and time   EKG:  EKG is ordered today. The ekg ordered 06/26/16 demonstrates sinus rhythm rate 63 bpm 01/03/17: Sinus rhythm.  Rate 62 bpm. 12/30/17: Sinus rhythm.  Rate 65 bpm.  LAFB.     LHC 05/21/16:  Ost LM to LM lesion, 40 %stenosed.  Ost Cx lesion, 90 %stenosed.  Ost Ramus lesion, 90 %stenosed. Ost 1st Mrg to 1st Mrg lesion, 90 %stenosed.  Ost LAD to Prox LAD lesion, 40 %stenosed. Mid LAD lesion, 75 %stenosed.  Prox nondominant RCA lesion, 90 %stenosed.  Dist LAD lesion, 40 %stenosed.  The left ventricular systolic function is normal. LV end diastolic pressure is normal.  Recent Labs: 04/03/2018: ALT 15; BUN 18; Creatinine, Ser 1.24; Potassium 3.8; Sodium 138   11/21/15 Sodium 138, potassium 3.7, BUN 22, creatinine 1.15 AST 16, ALT 15 Total cholesterol 183, triglycerides 145, HDL 47, LDL 107  12/03/17: Total cholesterol 146, triglycerides 97, HDL 42, LDL 85  WBC 9.6, hemoglobin 15.2, hematocrit 44.2, platelets 286 Sodium 142, potassium 3.6, BUN 17, creatinine 1.27 AST 17, ALT 70   Lipid Panel    Component Value Date/Time   CHOL 135 04/03/2018 0825   TRIG 127 04/03/2018 0825   HDL 40 04/03/2018 0825   CHOLHDL 3.4 04/03/2018 0825   CHOLHDL 3.0 01/03/2017 1437   VLDL 17 01/03/2017 1437   LDLCALC 70 04/03/2018 0825      Wt Readings from Last 3 Encounters:  12/31/18 289 lb 9.6 oz (131.4 kg)  12/30/17 295 lb (133.8 kg)  01/03/17 291 lb 9.6 oz (132.3 kg)      ASSESSMENT AND PLAN:  # CAD s/p CABG: Mr. Aird is doing well.  Encouraged him to start exercising more. Continue aspirin and atorvastatin. He is  not on a beta blocker due to bradycardia.  # Post-operative atrial fibrillation: No recurrent episodes since stopping amiodarone.  # Hypertension: Blood pressure is well-controlled on losartan/HCTZ.    # Hyperlipidemia: Continue atorvastatin.  LDL 70 on 03/2018.  Continue atorvastatin.   # Snoring: Patient is not interested in getting a sleep study.  # Obesity: Mr. Hiser was encouraged to start back walking and continued to improve his diet.   Current medicines are reviewed at length with the patient today.  The patient does not have concerns regarding medicines.  The following changes have been made:  none  Labs/ tests ordered today include:   No orders of the defined types were placed in this encounter.    Disposition:   FU with Elianys Conry C. Duke Salvia, MD, [facility] in 1 year    Signed, Imir Brumbach C. Duke Salvia, MD, [facility]  01/01/2019 12:50 AM     [facility]

## 2018-12-31 NOTE — Patient Instructions (Signed)
Medication Instructions:  Your physician recommends that you continue on your current medications as directed. Please refer to the Current Medication list given to you today.  If you need a refill on your cardiac medications before your next appointment, please call your pharmacy.   Lab work: NONE  Testing/Procedures: NONE  Follow-Up: At [facility], you and your health needs are our priority.  As part of our continuing mission to provide you with exceptional heart care, we have created designated Provider Care Teams.  These Care Teams include your primary Cardiologist (physician) and Advanced Practice Providers (APPs -  Physician Assistants and Nurse Practitioners) who all work together to provide you with the care you need, when you need it. You will need a follow up appointment in 12 months.  Please call our office 2 months in advance to schedule this appointment.  You may see Tiffany Ector, MD or one of the following Advanced Practice Providers on your designated Care Team:   Luke Kilroy, PA-C Krista Kroeger, PA-C . Callie Goodrich, PA-C    

## 2019-01-01 ENCOUNTER — Encounter: Payer: Self-pay | Admitting: Cardiovascular Disease

## 2019-01-05 ENCOUNTER — Other Ambulatory Visit: Payer: Self-pay | Admitting: Cardiovascular Disease

## 2019-01-25 NOTE — Progress Notes (Signed)
mychart 4/6, viewed

## 2019-04-19 ENCOUNTER — Other Ambulatory Visit: Payer: Self-pay | Admitting: Cardiovascular Disease

## 2019-04-26 ENCOUNTER — Telehealth: Payer: Self-pay | Admitting: Orthopedic Surgery

## 2019-04-26 NOTE — Telephone Encounter (Signed)
Patient request an rx for antibiotics to be called into [facility] on [address] for dental work patient is having on August 3rd.

## 2019-04-26 NOTE — Telephone Encounter (Signed)
Please advise 

## 2019-04-27 ENCOUNTER — Other Ambulatory Visit: Payer: Self-pay

## 2019-04-27 MED ORDER — CLINDAMYCIN HCL 150 MG PO CAPS: ORAL_CAPSULE | ORAL | 2 refills | Status: DC

## 2019-04-27 NOTE — Telephone Encounter (Signed)
Patient is a Dr. Marlou Sa patient. Aaron Edelman, please disregard message. Resent to Laurann Montana, Dr. Randel Pigg assistant.

## 2019-04-27 NOTE — Telephone Encounter (Signed)
Sent antibiotic  Patient advised done.

## 2020-01-03 ENCOUNTER — Telehealth: Payer: Self-pay | Admitting: *Deleted

## 2020-01-03 ENCOUNTER — Encounter: Payer: Self-pay | Admitting: *Deleted

## 2020-01-03 NOTE — Telephone Encounter (Signed)
Patient scheduled for in office visit on 01/06/2020 Dr Duke Salvia is doing virtual clinic. Left message to call back to make virtual visit

## 2020-01-06 ENCOUNTER — Telehealth (INDEPENDENT_AMBULATORY_CARE_PROVIDER_SITE_OTHER): Payer: BC Managed Care – PPO | Admitting: Cardiovascular Disease

## 2020-01-06 ENCOUNTER — Encounter: Payer: Self-pay | Admitting: Cardiovascular Disease

## 2020-01-06 VITALS — BP 131/73 | HR 57 | Ht 73.0 in | Wt 285.0 lb

## 2020-01-06 DIAGNOSIS — E669 Obesity, unspecified: Secondary | ICD-10-CM

## 2020-01-06 DIAGNOSIS — Z951 Presence of aortocoronary bypass graft: Secondary | ICD-10-CM | POA: Diagnosis not present

## 2020-01-06 DIAGNOSIS — I10 Essential (primary) hypertension: Secondary | ICD-10-CM

## 2020-01-06 DIAGNOSIS — E7800 Pure hypercholesterolemia, unspecified: Secondary | ICD-10-CM | POA: Diagnosis not present

## 2020-01-06 NOTE — Progress Notes (Signed)
Cardiology Office Note   Date:  01/06/2020   ID:  Scott Johnson, DOB 24-Mar-1946, MRN [id number]  PCP:  Daisy Floro, MD  Cardiologist:   Chilton Si, MD   No chief complaint on file.    History of Present Illness: Scott Johnson is a 74 y.o. 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  He was seen in clinic 05/15/16 and referred for LHC due to classic symptoms of angina.  Scott Johnson had a LHC 05/21/16 that revealed severe 3 vessel CAD.  He underwent 4 vessel CABG with Dr. Laneta Simmers on 06/13/16.  He had post-operative atrial fibrillation managed with IV amiodarone and successfully converted to sinus rhythm.  This was dicontinued when he followed up with Dr. Laneta Simmers on 09/2016.    Scott Johnson continues to do well.  He noticed that when he wasn't going to campus due to the pandemic he got deconditioned very quickly.  He is back to going to campus twice weekly and his breathing has improved.  He isn't getting any other exercise.  He denies exertional chest pain but does sometimes get short of breath.  He denies lower extremity edema, orthopnea or PND.  He struggles to eat healthy because he lives alone and doesn't cook.  He tries not eat late at night and limit portions.  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  He has follow up with his PCP today and will be getting his labs checked there.  His BP runs around130-135/70.  Past Medical History:  Diagnosis Date  . Arthritis   . Benign prostate hyperplasia    takes Proscar daily  . Colon polyp    benign  . Coronary artery disease   . Essential hypertension 05/15/2016  . History of bronchitis    many yrs ago  . Hyperlipidemia    takes Zocor daily   . Hypertension    takes Imdur,Metoprolol,and Losartan-HCTZ daily  . Joint  pain     Past Surgical History:  Procedure Laterality Date  . CARDIAC CATHETERIZATION N/A 05/21/2016   Procedure: Left Heart Cath and Coronary Angiography;  Surgeon: Marykay Lex, MD;  Location: [facility] INVASIVE CV LAB;  Service: Cardiovascular;  Laterality: N/A;  . COLONOSCOPY    . CORONARY ARTERY BYPASS GRAFT N/A 06/13/2016   Procedure: CORONARY ARTERY BYPASS GRAFTING (CABG)x 4 with endoscopic harvesting of right greater saphenous vein;  Surgeon: Alleen Borne, MD;  Location: [facility] OR;  Service: Open Heart Surgery;  Laterality: N/A;  . EYE SURGERY Bilateral 2015   Cataract removal  . TEE WITHOUT CARDIOVERSION N/A 06/13/2016   Procedure: TRANSESOPHAGEAL ECHOCARDIOGRAM (TEE);  Surgeon: Alleen Borne, MD;  Location[facility] OR;  Service: Open Heart Surgery;  Laterality: N/A;  . 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/26/2015   Procedure: TOTAL KNEE ARTHROPLASTY, CEMENTED;  Surgeon: Cammy Copa, MD;  Location: [facility] OR;  Service: Orthopedics;  Laterality: Right;     Current Outpatient Medications  Medication Sig Dispense Refill  . aspirin EC 81 MG tablet Take 81 mg by mouth daily.    Marland Kitchen atorvastatin (LIPITOR) 40 MG tablet TAKE 1 TABLET(40 MG) BY MOUTH DAILY AT 6 PM 90 tablet 2  . clindamycin (CLEOCIN) 150 MG capsule Take 4 capsules 1 hour prior to dental cleaning or dental  work. 12 capsule 2  . finasteride (PROSCAR) 5 MG tablet Take 5 mg by mouth daily at 12 noon.   3  . hydrochlorothiazide (HYDRODIURIL) 25 MG tablet Take 25 mg by mouth daily.    Marland Kitchen losartan (COZAAR) 100 MG tablet Take 100 mg by mouth daily.     No current facility-administered medications for this visit.    Allergies:   Penicillins    Social History:  The patient  reports that he has never smoked. He has never used smokeless tobacco. He reports that he does not drink alcohol or use drugs.   Family History:  The patient's family history includes COPD in his sister; Cancer in his sister;  Heart attack in his paternal grandmother; Heart disease in his maternal grandmother; Heart failure in his father, maternal grandfather, and mother; Hypertension in his mother.    ROS:  Please see the history of present illness.   Otherwise, review of systems are positive for none.   All other systems are reviewed and negative.    PHYSICAL EXAM: VS:  BP 131/73   Pulse (!) 57   Ht 6\' 1"  (1.854 m)   Wt 285 lb (129.3 kg)   SpO2 96%   BMI 37.60 kg/m  , BMI Body mass index is 37.6 kg/m. GENERAL:  Well appearing HEENT:  oral mucosa unremarkable NECK:  No jugular venous distention LUNGS:  Respirations unlabored HEART:  RRR.  PMI not displaced or sustained,S1 and S2 within normal limits, no S3, no S4, no clicks, no rubs, no murmurs EXT:  No edema, no cyanosis no clubbing SKIN:  No rashes no nodules NEURO:  Speech fluent.  motor grossly intact throughout PSYCH:  Cognitively intact, oriented to person place and time   EKG:  EKG is not ordered today. The ekg ordered 06/26/16 demonstrates sinus rhythm rate 63 bpm 01/03/17: Sinus rhythm.  Rate 62 bpm. 12/30/17: Sinus rhythm.  Rate 65 bpm.  LAFB.     LHC 05/21/16:  Ost LM to LM lesion, 40 %stenosed.  Ost Cx lesion, 90 %stenosed.  Ost Ramus lesion, 90 %stenosed. Ost 1st Mrg to 1st Mrg lesion, 90 %stenosed.  Ost LAD to Prox LAD lesion, 40 %stenosed. Mid LAD lesion, 75 %stenosed.  Prox nondominant RCA lesion, 90 %stenosed.  Dist LAD lesion, 40 %stenosed.  The left ventricular systolic function is normal. LV end diastolic pressure is normal.  Recent Labs: No results found for requested labs within last 8760 hours.   11/21/15 Sodium 138, potassium 3.7, BUN 22, creatinine 1.15 AST 16, ALT 15 Total cholesterol 183, triglycerides 145, HDL 47, LDL 107  12/03/17: Total cholesterol 146, triglycerides 97, HDL 42, LDL 85 WBC 9.6, hemoglobin 15.2, hematocrit 44.2, platelets 286 Sodium 142, potassium 3.6, BUN 17, creatinine 1.27 AST 17, ALT  70   Lipid Panel    Component Value Date/Time   CHOL 135 04/03/2018 0825   TRIG 127 04/03/2018 0825   HDL 40 04/03/2018 0825   CHOLHDL 3.4 04/03/2018 0825   CHOLHDL 3.0 01/03/2017 1437   VLDL 17 01/03/2017 1437   LDLCALC 70 04/03/2018 0825      Wt Readings from Last 3 Encounters:  01/06/20 285 lb (129.3 kg)  12/31/18 289 lb 9.6 oz (131.4 kg)  12/30/17 295 lb (133.8 kg)      ASSESSMENT AND PLAN:  # CAD s/p CABG: Scott Johnson is doing well.  Encouraged him to start exercising more.  Add walking to his work routine. Continue aspirin and atorvastatin. He is not  on a beta blocker due to bradycardia.  # Bradycardia: Asymptomatic.  # Post-operative atrial fibrillation: No recurrent episodes since stopping amiodarone.  # Hypertension: Blood pressure is well-controlled on losartan/HCTZ.    # Hyperlipidemia: Continue atorvastatin.  Lipids will be checked with Dr. Harrington Challenger.   # Snoring: Patient is not interested in getting a sleep study.  # Obesity: Scott Johnson was encouraged to start back walking and continued to improve his diet.   Current medicines are reviewed at length with the patient today.  The patient does not have concerns regarding medicines.  The following changes have been made:  none  Labs/ tests ordered today include:   No orders of the defined types were placed in this encounter.    Disposition:   FU with Samhita Kretsch C. Oval Linsey, MD, [facility] in 1 year    Signed, Arleatha Philipps C. Oval Linsey, MD, [facility]  01/06/2020 11:04 AM    [place]

## 2020-01-06 NOTE — Patient Instructions (Signed)
Medication Instructions:  Your physician recommends that you continue on your current medications as directed. Please refer to the Current Medication list given to you today.   *If you need a refill on your cardiac medications before your next appointment, please call your pharmacy*  Lab Work: NONE   Testing/Procedures: NONE  Follow-Up: At [facility], you and your health needs are our priority.  As part of our continuing mission to provide you with exceptional heart care, we have created designated Provider Care Teams.  These Care Teams include your primary Cardiologist (physician) and Advanced Practice Providers (APPs -  Physician Assistants and Nurse Practitioners) who all work together to provide you with the care you need, when you need it.  We recommend signing up for the patient portal called "MyChart".  Sign up information is provided on this After Visit Summary.  MyChart is used to connect with patients for Virtual Visits (Telemedicine).  Patients are able to view lab/test results, encounter notes, upcoming appointments, etc.  Non-urgent messages can be sent to your provider as well.   To learn more about what you can do with MyChart, go to [email or web address].    Your next appointment:   12 month(s) You will receive a reminder letter in the mail two months in advance. If you don't receive a letter, please call our office to schedule the follow-up appointment.   The format for your next appointment:   In Person  Provider:   You may see Tiffany [place], MD or one of the following Advanced Practice Providers on your designated Care Team:    Luke Kilroy, PA-C  Callie Goodrich, PA-C  Jesse Cleaver, FNP    

## 2020-01-12 NOTE — Telephone Encounter (Signed)
Patient had virtual visit 3/18

## 2020-07-12 ENCOUNTER — Encounter: Payer: Self-pay | Admitting: Dermatology

## 2020-07-12 ENCOUNTER — Other Ambulatory Visit: Payer: Self-pay

## 2020-07-12 ENCOUNTER — Ambulatory Visit: Payer: BC Managed Care – PPO | Admitting: Dermatology

## 2020-07-12 DIAGNOSIS — L821 Other seborrheic keratosis: Secondary | ICD-10-CM

## 2020-07-12 DIAGNOSIS — Z1283 Encounter for screening for malignant neoplasm of skin: Secondary | ICD-10-CM | POA: Diagnosis not present

## 2020-07-12 DIAGNOSIS — L720 Epidermal cyst: Secondary | ICD-10-CM

## 2020-07-12 DIAGNOSIS — L738 Other specified follicular disorders: Secondary | ICD-10-CM

## 2020-07-12 DIAGNOSIS — L729 Follicular cyst of the skin and subcutaneous tissue, unspecified: Secondary | ICD-10-CM

## 2020-07-12 NOTE — Patient Instructions (Signed)
Follow-up in many years for Scott Johnson date of birth [birth year]. We looked at his legs and all the skin from the waist up as well as the buttocks. There are no atypical moles, melanoma, or skin cancer. He is prone to get several types of clinically benign spots. On his back and on the left temple and cheek are brown textured flat topped elevations called seborrheic keratoses. These do not require watching or freezing or biopsy. On the forehead and the cheeks are subtle flesh-colored sometimes dimpled to 3 mm bumps called sebaceous gland hyperplasia which are likewise benign. They may mimic an early basal cell cancer so if one of them were to grow and bleed then I would like him to return. On the right temple is a white 8 mm epidermoid cyst which is quite harmless. [name] was nice enough to let me chat about some reading I did recently on a new product called wegovy which is a semaglutide that recently got approved to reset your brains energy center and help reduce genuine progressive weight loss. This is something that might be prescribed by his primary care doctor Dr. Tenny Craw or his cardiologist, and he stated he might discuss this with them or he can Google search the name of the medicine and do some reading on it. His skin really does not need any short-term follow-up and it is probably safe for him to see a dermatologist every 3 years unless he sees a change.

## 2020-07-28 ENCOUNTER — Ambulatory Visit: Payer: BC Managed Care – PPO | Admitting: Orthopedic Surgery

## 2020-07-28 ENCOUNTER — Telehealth: Payer: Self-pay

## 2020-07-28 ENCOUNTER — Other Ambulatory Visit: Payer: Self-pay

## 2020-07-28 ENCOUNTER — Ambulatory Visit: Payer: Self-pay

## 2020-07-28 ENCOUNTER — Encounter: Payer: Self-pay | Admitting: Orthopedic Surgery

## 2020-07-28 VITALS — Ht 73.0 in | Wt 287.0 lb

## 2020-07-28 DIAGNOSIS — M25562 Pain in left knee: Secondary | ICD-10-CM

## 2020-07-28 DIAGNOSIS — M1712 Unilateral primary osteoarthritis, left knee: Secondary | ICD-10-CM | POA: Diagnosis not present

## 2020-07-28 NOTE — Telephone Encounter (Signed)
Noted  

## 2020-07-28 NOTE — Telephone Encounter (Signed)
Need auth for left knee gel injection  

## 2020-07-28 NOTE — Progress Notes (Signed)
Office Visit Note   Patient: Scott Johnson           Date of Birth: Feb 04, 1946           MRN: [id number] Visit Date: 07/28/2020 Requested by: Daisy Floro, MD [address] [place],  [place] [address] PCP: Daisy Floro, MD  Subjective: Chief Complaint  Patient presents with  . Left Knee - Pain    HPI: Scott Johnson is a 74 y.o. male who presents to the office complaining of left knee pain.  Patient has a history of left knee pain that has been worsening.  He has history of right knee replacement but no surgery to his left knee.  The right knee replacement is doing very well.  He is not really yet ready to consider left knee replacement.  He localizes the majority of his pain to the medial aspect of the left knee as well as the posterior left knee.  Denies any locking symptoms or giving way of the knee.  Denies any groin pain, low back pain, numbness/tingling, subjective weakness.  Does not wake with pain.  Has no history of injections into the knee.  Pain is worse with flexion.  He does not take any medications for his pain..                ROS: All systems reviewed are negative as they relate to the chief complaint within the history of present illness.  Patient denies fevers or chills.  Assessment & Plan: Visit Diagnoses:  1. Left knee pain, unspecified chronicity   2. Unilateral primary osteoarthritis, left knee     Plan: Patient is a 74 year old male who presents complaining of left knee pain.  Radiographs taken today reveal right knee replacement prosthesis in good position with the left knee degenerative changes that are worst in the medial compartment.  He is a Runner, broadcasting/film/video at [employer].  Symptoms are not bad enough to consider knee replacement at this time.  Plan to proceed with left knee cortisone injection today.  Patient tolerated the procedure well.  We will preapproved him for left knee gel injections as well.  Follow-up as needed.  Follow-Up Instructions: No  follow-ups on file.   Orders:  Orders Placed This Encounter  Procedures  . XR KNEE 3 VIEW LEFT   No orders of the defined types were placed in this encounter.     Procedures: Large Joint Inj: L knee on 07/28/2020 8:21 PM Indications: diagnostic evaluation, joint swelling and pain Details: 18 G 1.5 in needle, superolateral approach  Arthrogram: No  Medications: 5 mL lidocaine 1 %; 40 mg methylPREDNISolone acetate 40 MG/ML; 4 mL bupivacaine 0.25 % Outcome: tolerated well, no immediate complications Procedure, treatment alternatives, risks and benefits explained, specific risks discussed. Consent was given by the patient. Immediately prior to procedure a time out was called to verify the correct patient, procedure, equipment, support staff and site/side marked as required. Patient was prepped and draped in the usual sterile fashion.       Clinical Data: No additional findings.  Objective: Vital Signs: Ht 6\' 1"  (1.854 m)   Wt 287 lb (130.2 kg)   BMI 37.87 kg/m   Physical Exam:  Constitutional: Patient appears well-developed HEENT:  Head: Normocephalic Eyes:EOM are normal Neck: Normal range of motion Cardiovascular: Normal rate Pulmonary/chest: Effort normal Neurologic: Patient is alert Skin: Skin is warm Psychiatric: Patient has normal mood and affect  Ortho Exam: Ortho exam demonstrates left knee with no significant  effusion.  Tenderness over the medial joint line is present.  No tenderness over the lateral joint line.  No pain with patellar range of motion or patellar grind test.  Knee lacks about 5 degrees of full extension.  Able to flex past 90 degrees.  No calf tenderness on exam.  No pain with hip range of motion.  Negative straight leg raise.  Specialty Comments:  No specialty comments available.  Imaging: XR KNEE 3 VIEW LEFT  Result Date: 07/28/2020 AP lateral merchant left knee reviewed.  Total knee prosthesis in good position alignment on the right-hand  side.  On the left-hand side there is tricompartmental osteoarthritis worse in the medial compartment with slight varus alignment.  No acute fracture.    PMFS History: Patient Active Problem List   Diagnosis Date Noted  . 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 Angina, class III (HCC) 05/15/2016  . Essential hypertension 05/15/2016  . Hyperlipidemia 05/15/2016  . Obesity (BMI 30-39.9) 05/15/2016  . Degenerative arthritis of right knee 09/26/2015   Past Medical History:  Diagnosis Date  . Arthritis   . Benign prostate hyperplasia    takes Proscar daily  . Colon polyp    benign  . Coronary artery disease   . Essential hypertension 05/15/2016  . History of bronchitis    many yrs ago  . Hyperlipidemia    takes Zocor daily   . Hypertension    takes Imdur,Metoprolol,and Losartan-HCTZ daily  . Joint pain     Family History  Problem Relation Age of Onset  . Heart failure Mother   . Hypertension Mother   . Heart failure Father   . Cancer Sister   . COPD Sister   . Heart disease Maternal Grandmother   . Heart failure Maternal Grandfather   . Heart attack Paternal Grandmother     Past Surgical History:  Procedure Laterality Date  . CARDIAC CATHETERIZATION N/A 05/21/2016   Procedure: Left Heart Cath and Coronary Angiography;  Surgeon: Marykay Lex, MD;  Location: [facility] INVASIVE CV LAB;  Service: Cardiovascular;  Laterality: N/A;  . COLONOSCOPY    . CORONARY ARTERY BYPASS GRAFT N/A 06/13/2016   Procedure: CORONARY ARTERY BYPASS GRAFTING (CABG)x 4 with endoscopic harvesting of right greater saphenous vein;  Surgeon: Alleen Borne, MD;  Location: [facility] OR;  Service: Open Heart Surgery;  Laterality: N/A;  . EYE SURGERY Bilateral 2015   Cataract removal  . TEE WITHOUT CARDIOVERSION N/A 06/13/2016   Procedure: TRANSESOPHAGEAL ECHOCARDIOGRAM (TEE);  Surgeon: Alleen Borne, MD;  Location: [facility] OR;  Service: Open Heart Surgery;  Laterality: N/A;  . 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 TOTAL KNEE  ARTHROPLASTY Right 09/26/2015   Procedure: TOTAL KNEE ARTHROPLASTY, CEMENTED;  Surgeon: Cammy Copa, MD;  Location: [facility] OR;  Service: Orthopedics;  Laterality: Right;   Social History   Occupational History  . Not on file  Tobacco Use  . Smoking status: Never Smoker  . Smokeless tobacco: Never Used  Vaping Use  . Vaping Use: Never used  Substance and Sexual Activity  . Alcohol use: No  . Drug use: No  . Sexual activity: Not on file

## 2020-07-30 ENCOUNTER — Encounter: Payer: Self-pay | Admitting: Orthopedic Surgery

## 2020-07-30 MED ORDER — LIDOCAINE HCL 1 % IJ SOLN
5.0000 mL | INTRAMUSCULAR | Status: AC | PRN
  Administered 2020-07-28: 5 mL

## 2020-07-30 MED ORDER — BUPIVACAINE HCL 0.25 % IJ SOLN
4.0000 mL | INTRAMUSCULAR | Status: AC | PRN
  Administered 2020-07-28: 4 mL via INTRA_ARTICULAR

## 2020-07-30 MED ORDER — METHYLPREDNISOLONE ACETATE 40 MG/ML IJ SUSP
40.0000 mg | INTRAMUSCULAR | Status: AC | PRN
  Administered 2020-07-28: 40 mg via INTRA_ARTICULAR

## 2020-08-01 ENCOUNTER — Telehealth: Payer: Self-pay

## 2020-08-01 NOTE — Telephone Encounter (Signed)
Submitted VOB, SynviscOne, left knee. 

## 2020-08-10 NOTE — Progress Notes (Signed)
   New Patient   Subjective  Scott Johnson is a 74 y.o. male who presents for the following: Annual Exam (LAST OV 2014).  Annual skin exam Location:  Duration:  Quality:  Associated Signs/Symptoms: Modifying Factors:  Severity:  Timing: Context:    The following portions of the chart were reviewed this encounter and updated as appropriate: Tobacco  Allergies  Meds  Problems  Med Hx  Surg Hx  Fam Hx      Objective  Well appearing patient in no apparent distress; mood and affect are within normal limits.  A full examination was performed including scalp, head, eyes, ears, nose, lips, neck, chest, axillae, abdomen, back, buttocks, bilateral upper extremities, bilateral lower extremities, hands, feet, fingers, toes, fingernails, and toenails. All findings within normal limits unless otherwise noted below.   Assessment & Plan    First follow-up in many years for Scott Johnson date of birth Aug 26, 1946. We looked at his legs and all the skin from the waist up as well as the buttocks. There are no atypical moles, melanoma, or skin cancer. He is prone to get several types of clinically benign spots. On his back and on the left temple and cheek are brown textured flat topped elevations called seborrheic keratoses. These do not require watching or freezing or biopsy. On the forehead and the cheeks are subtle flesh-colored sometimes dimpled 2 to 3 mm bumps called sebaceous gland hyperplasia which are likewise benign. They may mimic an early basal cell cancer so if one of them were to grow and bleed then I would like him to return. On the right temple is a white 8 mm epidermoid cyst which is quite harmless. Scott Johnson was nice enough to let me chat about some reading I did recently on a new product called wegovy which is a semaglutide that recently got approved to reset your brains energy center and help produce genuine progressive weight loss. This is something that might be prescribed by his  primary care doctor Dr. Tenny Johnson or his cardiologist, and he stated he might discuss this with them or he can Google search the name of the medicine and do some reading on it. His skin really does not need any short-term follow-up and it is probably safe for him to see a dermatologist every 3 years unless he sees a change.  Encounter for screening for malignant neoplasm of skin Right Breast  Yearly skin check  Seborrheic keratosis (3) Left Upper Back; Left Temple; Left Parotid Area  Okay to leave if stable  Sebaceous gland hyperplasia (3) Mid Forehead; Right Parotid Area; Left Buccal Cheek   Benign- no treatement needed  Cyst of skin Right Frontal Scalp  Benign- doesn't need to be removed if stable

## 2020-08-14 ENCOUNTER — Encounter: Payer: Self-pay | Admitting: Dermatology

## 2020-08-16 ENCOUNTER — Telehealth: Payer: Self-pay

## 2020-08-16 NOTE — Telephone Encounter (Signed)
PA required for SynviscOne, left knee. Faxed completed PA form to [employer] at [phone]. 

## 2020-08-24 ENCOUNTER — Telehealth: Payer: Self-pay

## 2020-08-24 NOTE — Telephone Encounter (Signed)
Approved, SynviscOne, left knee. Buy & Bill Covered at 100% through his insurance [employer]) Co-pay of $80.00 PA required PA Approval #[id number] Valid 08/16/2020- 02/12/2021  Appt. 09/06/2020 with Dr. August Saucer

## 2020-09-06 ENCOUNTER — Ambulatory Visit (INDEPENDENT_AMBULATORY_CARE_PROVIDER_SITE_OTHER): Payer: BC Managed Care – PPO | Admitting: Orthopedic Surgery

## 2020-09-06 DIAGNOSIS — M1712 Unilateral primary osteoarthritis, left knee: Secondary | ICD-10-CM

## 2020-09-10 ENCOUNTER — Encounter: Payer: Self-pay | Admitting: Orthopedic Surgery

## 2020-09-10 DIAGNOSIS — M1712 Unilateral primary osteoarthritis, left knee: Secondary | ICD-10-CM | POA: Diagnosis not present

## 2020-09-10 MED ORDER — HYLAN G-F 20 48 MG/6ML IX SOSY
48.0000 mg | PREFILLED_SYRINGE | INTRA_ARTICULAR | Status: AC | PRN
  Administered 2020-09-10: 48 mg via INTRA_ARTICULAR

## 2020-09-10 MED ORDER — LIDOCAINE HCL 1 % IJ SOLN
5.0000 mL | INTRAMUSCULAR | Status: AC | PRN
  Administered 2020-09-10: 5 mL

## 2020-09-10 NOTE — Progress Notes (Signed)
   Procedure Note  Patient: Jasmon Graffam             Date of Birth: February 13, 1946           MRN: [id number]             Visit Date: 09/06/2020  Procedures: Visit Diagnoses:  1. Unilateral primary osteoarthritis, left knee     Large Joint Inj: L knee on 09/10/2020 5:53 PM Indications: pain, joint swelling and diagnostic evaluation Details: 18 G 1.5 in needle, superolateral approach  Arthrogram: No  Medications: 5 mL lidocaine 1 %; 48 mg Hylan 48 MG/6ML Outcome: tolerated well, no immediate complications  [name] had 80% relief from last cortisone injection.   Noted on physical exam today was a ganglion cyst near the left fibular head that measures 4 x 4 mm and feels freely mobile with no tenderness. Plan to continue watching this at his next appointment. Procedure, treatment alternatives, risks and benefits explained, specific risks discussed. Consent was given by the patient. Immediately prior to procedure a time out was called to verify the correct patient, procedure, equipment, support staff and site/side marked as required. Patient was prepped and draped in the usual sterile fashion.     Patient also asked about a nodule around his left knee.  He does have a subcutaneous nodule 4 x 4 mm nontender which feels cystic in nature freely mobile beneath the skin.  This is something we can watch.  On the lateral side.

## 2020-11-02 ENCOUNTER — Ambulatory Visit: Payer: Self-pay | Admitting: Podiatry

## 2020-11-08 ENCOUNTER — Ambulatory Visit (INDEPENDENT_AMBULATORY_CARE_PROVIDER_SITE_OTHER): Payer: BC Managed Care – PPO

## 2020-11-08 ENCOUNTER — Encounter: Payer: Self-pay | Admitting: Podiatry

## 2020-11-08 ENCOUNTER — Ambulatory Visit: Payer: BC Managed Care – PPO | Admitting: Podiatry

## 2020-11-08 ENCOUNTER — Other Ambulatory Visit: Payer: Self-pay

## 2020-11-08 DIAGNOSIS — L97521 Non-pressure chronic ulcer of other part of left foot limited to breakdown of skin: Secondary | ICD-10-CM

## 2020-11-08 DIAGNOSIS — M79675 Pain in left toe(s): Secondary | ICD-10-CM

## 2020-11-08 DIAGNOSIS — M2041 Other hammer toe(s) (acquired), right foot: Secondary | ICD-10-CM

## 2020-11-08 DIAGNOSIS — M2042 Other hammer toe(s) (acquired), left foot: Secondary | ICD-10-CM

## 2020-11-08 DIAGNOSIS — B351 Tinea unguium: Secondary | ICD-10-CM

## 2020-11-08 DIAGNOSIS — M79674 Pain in right toe(s): Secondary | ICD-10-CM | POA: Diagnosis not present

## 2020-11-09 NOTE — Progress Notes (Signed)
Subjective:   Patient ID: Scott Johnson, male   DOB: 75 y.o.   MRN: [id number]   HPI Patient presents stating that he has a breakdown of tissue around his left big toe history of callus formation history of foot structural issues and severely thickened nail disease 1-5 both feet that he cannot take care of himself.  States that he has family history of this with the father having had same issues and does not have diabetes.  Patient does not smoke likes to be active   Review of Systems  All other systems reviewed and are negative.       Objective:  Physical Exam Vitals and nursing note reviewed.  Constitutional:      Appearance: He is well-developed and well-nourished.  Cardiovascular:     Pulses: Intact distal pulses.  Pulmonary:     Effort: Pulmonary effort is normal.  Musculoskeletal:        General: Normal range of motion.  Skin:    General: Skin is warm.  Neurological:     Mental Status: He is alert.     Neurovascular status is found to be intact muscle strength is adequate range of motion adequate patient found to have significant cavus structure elevation of the digits with distal keratotic lesions with low-grade breakdown of tissue of the left hallux that is currently bleeding with no proximal edema erythema or drainage noted.  The nailbeds are all thick 1-5 both feet and yellow with brittle with pain.  Patient does have good digital perfusion well oriented x3     Assessment:  Severe callus formation with low-grade breakdown to treat throughout the left hallux localized with no proximal edema erythema drainage no odor no active drainage with thick yellow brittle mycotic nail infection with pain 1-5 both feet     Plan:  H&P reviewed conditions x-rays and at this point debrided lesion left no iatrogenic bleeding flush the area applied dressing and instructed on soaks.  Debrided other lesions debrided nailbeds 1-5 both feet no iatrogenic bleeding and patient will be  seen back for routine care and we will see how he responds to this generalized local care.  If you were to get any redness swelling or drainage or odor he is to come in immediately  X-rays indicate significant structural cavus foot type with digital deformities

## 2021-01-16 ENCOUNTER — Encounter: Payer: Self-pay | Admitting: Cardiovascular Disease

## 2021-01-16 ENCOUNTER — Other Ambulatory Visit: Payer: Self-pay

## 2021-01-16 ENCOUNTER — Ambulatory Visit (INDEPENDENT_AMBULATORY_CARE_PROVIDER_SITE_OTHER): Payer: BC Managed Care – PPO | Admitting: Cardiovascular Disease

## 2021-01-16 VITALS — BP 120/64 | HR 61 | Ht 73.0 in | Wt 284.0 lb

## 2021-01-16 DIAGNOSIS — I10 Essential (primary) hypertension: Secondary | ICD-10-CM

## 2021-01-16 DIAGNOSIS — E7800 Pure hypercholesterolemia, unspecified: Secondary | ICD-10-CM

## 2021-01-16 DIAGNOSIS — E669 Obesity, unspecified: Secondary | ICD-10-CM | POA: Diagnosis not present

## 2021-01-16 DIAGNOSIS — Z951 Presence of aortocoronary bypass graft: Secondary | ICD-10-CM | POA: Diagnosis not present

## 2021-01-16 NOTE — Patient Instructions (Signed)
Medication Instructions:  Your physician recommends that you continue on your current medications as directed. Please refer to the Current Medication list given to you today.   *If you need a refill on your cardiac medications before your next appointment, please call your pharmacy*  Lab Work: NONE  Testing/Procedures: NONE   Follow-Up: At [facility], you and your health needs are our priority.  As part of our continuing mission to provide you with exceptional heart care, we have created designated Provider Care Teams.  These Care Teams include your primary Cardiologist (physician) and Advanced Practice Providers (APPs -  Physician Assistants and Nurse Practitioners) who all work together to provide you with the care you need, when you need it.  We recommend signing up for the patient portal called "MyChart".  Sign up information is provided on this After Visit Summary.  MyChart is used to connect with patients for Virtual Visits (Telemedicine).  Patients are able to view lab/test results, encounter notes, upcoming appointments, etc.  Non-urgent messages can be sent to your provider as well.   To learn more about what you can do with MyChart, go to [email or web address].    Your next appointment:   12 month(s)  You will receive a reminder letter in the mail two months in advance. If you don't receive a letter, please call our office to schedule the follow-up appointment.  The format for your next appointment:   In Person  Provider:   DR [facility] OR PA/NP AT [facility] LOCATION

## 2021-01-16 NOTE — Progress Notes (Signed)
Cardiology Office Note   Date:  01/16/2021   ID:  Scott Johnson, DOB February 08, 1946, MRN [id number]  PCP:  Scott Floro, MD  Cardiologist:   Scott Si, MD   Chief Complaint  Patient presents with  . Follow-up    12 months.     History of Present Illness: Scott Johnson is a 75 y.o. 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  He was seen in clinic 05/15/16 and referred for LHC due to classic symptoms of angina.  Scott Johnson had a LHC 05/21/16 that revealed severe 3 vessel CAD.  He underwent 4 vessel CABG with Dr. Laneta Simmers on 06/13/16.  He had post-operative atrial fibrillation managed with IV amiodarone and successfully converted to sinus rhythm.  This was dicontinued when he followed up with Dr. Laneta Simmers on 09/2016.    Since his last appointment Scott Johnson has been doing well.  He is planning to retire in December.  He continues to walk around campus but is not getting much exercise outside of work.  He struggles with pain in his left knee.  He saw orthopedics and thought he was going to need a knee replacement.  However they did a gel treatment and that seems to be helping.  He has no chest pain or shortness of breath.  He denies lower extremity edema, orthopnea, or PND.  He continues to try and limit carbohydrates since the time of his surgery and is try to eat more vegetables.  He does struggle because he is not a great cook and has a lot of prepackaged meals.  He does not check his blood pressure often.  When he does the highest he is ever seen is 135 systolic.  Overall he is well and without complaint.   Past Medical History:  Diagnosis Date  . Arthritis   . Benign prostate hyperplasia    takes Proscar daily  . Colon polyp    benign  . Coronary artery disease   . Essential  hypertension 05/15/2016  . History of bronchitis    many yrs ago  . Hyperlipidemia    takes Zocor daily   . Hypertension    takes Imdur,Metoprolol,and Losartan-HCTZ daily  . Joint pain     Past Surgical History:  Procedure Laterality Date  . CARDIAC CATHETERIZATION N/A 05/21/2016   Procedure: Left Heart Cath and Coronary Angiography;  Surgeon: Marykay Lex, MD;  Location: [facility] INVASIVE CV LAB;  Service: Cardiovascular;  Laterality: N/A;  . COLONOSCOPY    . CORONARY ARTERY BYPASS GRAFT N/A 06/13/2016   Procedure: CORONARY ARTERY BYPASS GRAFTING (CABG)x 4 with endoscopic harvesting of right greater saphenous vein;  Surgeon: Alleen Borne, MD;  Location: [facility] OR;  Service: Open Heart Surgery;  Laterality: N/A;  . EYE SURGERY Bilateral 2015   Cataract removal  . TEE WITHOUT CARDIOVERSION N/A 06/13/2016   Procedure: TRANSESOPHAGEAL ECHOCARDIOGRAM (TEE);  Surgeon: Alleen Borne, MD;  Location: [facility] OR;  Service: Open Heart Surgery;  Laterality: N/A;  . 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/26/2015   Procedure: TOTAL KNEE ARTHROPLASTY, CEMENTED;  Surgeon: Cammy Copa, MD;  Location: [facility] OR;  Service: Orthopedics;  Laterality: Right;     Current Outpatient Medications  Medication Sig Dispense Refill  . aspirin EC 81 MG tablet Take 81  mg by mouth daily.    Marland Kitchen atorvastatin (LIPITOR) 40 MG tablet TAKE 1 TABLET(40 MG) BY MOUTH DAILY AT 6 PM 90 tablet 2  . Cholecalciferol (VITAMIN D3) 125 MCG (5000 UT) CAPS See admin instructions.    . clindamycin (CLEOCIN) 150 MG capsule Take 4 capsules 1 hour prior to dental cleaning or dental work. 12 capsule 2  . finasteride (PROSCAR) 5 MG tablet Take 5 mg by mouth daily at 12 noon.   3  . hydrochlorothiazide (HYDRODIURIL) 25 MG tablet Take 25 mg by mouth daily.    Marland Kitchen losartan (COZAAR) 100 MG tablet Take 100 mg by mouth daily.     No current facility-administered medications for this visit.    Allergies:    Penicillins    Social History:  The patient  reports that he has never smoked. He has never used smokeless tobacco. He reports that he does not drink alcohol and does not use drugs.   Family History:  The patient's family history includes COPD in his sister; Cancer in his sister; Heart attack in his paternal grandmother; Heart disease in his maternal grandmother; Heart failure in his father, maternal grandfather, and mother; Hypertension in his mother.    ROS:  Please see the history of present illness.   Otherwise, review of systems are positive for none.   All other systems are reviewed and negative.    PHYSICAL EXAM: VS:  BP 120/64 (BP Location: Left Arm, Patient Position: Sitting, Cuff Size: Large)   Pulse 61   Ht 6\' 1"  (1.854 m)   Wt 284 lb (128.8 kg)   BMI 37.47 kg/m  , BMI Body mass index is 37.47 kg/m. GENERAL:  Well appearing HEENT: Pupils equal round and reactive, fundi not visualized, oral mucosa unremarkable NECK:  No jugular venous distention, waveform within normal limits, carotid upstroke brisk and symmetric, no bruits LUNGS:  Clear to auscultation bilaterally HEART:  RRR.  PMI not displaced or sustained,S1 and S2 within normal limits, no S3, no S4, no clicks, no rubs, no murmurs ABD:  Flat, positive bowel sounds normal in frequency in pitch, no bruits, no rebound, no guarding, no midline pulsatile mass, no hepatomegaly, no splenomegaly EXT:  2 plus pulses throughout, no edema, no cyanosis no clubbing SKIN:  No rashes no nodules NEURO:  Cranial nerves II through XII grossly intact, motor grossly intact throughout PSYCH:  Cognitively intact, oriented to person place and time   EKG:  EKG is ordered today. The ekg ordered 06/26/16 demonstrates sinus rhythm rate 63 bpm 01/03/17: Sinus rhythm.  Rate 62 bpm. 12/30/17: Sinus rhythm.  Rate 65 bpm.  LAFB.   01/16/2021: Sinus rhythm.  Rate 61 bpm.  LHC 05/21/16:  Ost LM to LM lesion, 40 %stenosed.  Ost Cx lesion, 90  %stenosed.  Ost Ramus lesion, 90 %stenosed. Ost 1st Mrg to 1st Mrg lesion, 90 %stenosed.  Ost LAD to Prox LAD lesion, 40 %stenosed. Mid LAD lesion, 75 %stenosed.  Prox nondominant RCA lesion, 90 %stenosed.  Dist LAD lesion, 40 %stenosed.  The left ventricular systolic function is normal. LV end diastolic pressure is normal.  Recent Labs: No results found for requested labs within last 8760 hours.   11/21/15 Sodium 138, potassium 3.7, BUN 22, creatinine 1.15 AST 16, ALT 15 Total cholesterol 183, triglycerides 145, HDL 47, LDL 107  12/03/17: Total cholesterol 146, triglycerides 97, HDL 42, LDL 85 WBC 9.6, hemoglobin 15.2, hematocrit 44.2, platelets 286 Sodium 142, potassium 3.6, BUN 17, creatinine 1.27 AST 17,  ALT 70  01/11/2021: Total cholesterol 134, triglycerides 105, HDL 42, LDL 73 Sodium 139, potassium 4.1, BUN 26, creatinine 1.5 AST 20, ALT 17    Lipid Panel    Component Value Date/Time   CHOL 135 04/03/2018 0825   TRIG 127 04/03/2018 0825   HDL 40 04/03/2018 0825   CHOLHDL 3.4 04/03/2018 0825   CHOLHDL 3.0 01/03/2017 1437   VLDL 17 01/03/2017 1437   LDLCALC 70 04/03/2018 0825      Wt Readings from Last 3 Encounters:  01/16/21 284 lb (128.8 kg)  07/28/20 287 lb (130.2 kg)  01/06/20 285 lb (129.3 kg)      ASSESSMENT AND PLAN:  # CAD s/p CABG:  # Hyperlipidemia:  Scott Johnson continues to do well.  He is not on a beta blocker due to bradycardia.  Again encouraged to increase exercise to 150 minutes weekly.  Lipids are nearly-controlled on atorvastatin.  Continue 40 mg daily as well as aspirin 81 mg daily.  # Bradycardia: Asymptomatic.  # Post-operative atrial fibrillation: No recurrent episodes since stopping amiodarone.  # Hypertension: Blood pressure is well-controlled on losartan/HCTZ.   Continue atorvastatin.  Lipids will be checked with Dr. Tenny Craw.   # Snoring: Patient is not interested in getting a sleep study.  # Obesity: Scott Johnson was  encouraged to start back walking and continued to improve his diet.   Current medicines are reviewed at length with the patient today.  The patient does not have concerns regarding medicines.  The following changes have been made:  none  Labs/ tests ordered today include:   Orders Placed This Encounter  Procedures  . EKG 12-Lead     Disposition:   FU with Scott Cottingham C. Duke Salvia, MD, [facility] in 1 year    Signed, Scott Hammerschmidt C. Duke Salvia, MD, [facility]  01/16/2021 9:27 AM    [facility]

## 2021-03-08 ENCOUNTER — Other Ambulatory Visit: Payer: Self-pay

## 2021-03-08 ENCOUNTER — Encounter: Payer: Self-pay | Admitting: Podiatry

## 2021-03-08 ENCOUNTER — Ambulatory Visit: Payer: BC Managed Care – PPO | Admitting: Podiatry

## 2021-03-08 DIAGNOSIS — B351 Tinea unguium: Secondary | ICD-10-CM | POA: Diagnosis not present

## 2021-03-08 DIAGNOSIS — M79675 Pain in left toe(s): Secondary | ICD-10-CM

## 2021-03-08 DIAGNOSIS — M79674 Pain in right toe(s): Secondary | ICD-10-CM | POA: Diagnosis not present

## 2021-03-08 DIAGNOSIS — L84 Corns and callosities: Secondary | ICD-10-CM

## 2021-03-08 NOTE — Progress Notes (Signed)
Subjective:   Patient ID: Scott Johnson, male   DOB: 75 y.o.   MRN: [id number]   HPI Patient presents with severely thickened yellow brittle nailbeds 1-5 both feet that are painful and he cannot cut and also has lesions on the sole of fifth metatarsal head bilateral hallux left third digit right foot painful that he cannot take care of   ROS      Objective:  Physical Exam  Neurovascular status unchanged with thick yellow brittle nailbeds 1-5 both feet painful and lesions bilateral painful when palpated     Assessment:  Chronic mycotic nail infection with pain 1-5 both feet with lesion formation bilateral     Plan:  Debrided nailbeds 1-5 both feet lesions bilateral no iatrogenic bleeding reappoint routine care

## 2021-03-26 ENCOUNTER — Other Ambulatory Visit: Payer: Self-pay | Admitting: Cardiovascular Disease

## 2021-07-11 ENCOUNTER — Other Ambulatory Visit: Payer: Self-pay

## 2021-07-11 ENCOUNTER — Ambulatory Visit: Payer: BC Managed Care – PPO | Admitting: Podiatry

## 2021-07-11 ENCOUNTER — Encounter: Payer: Self-pay | Admitting: Podiatry

## 2021-07-11 DIAGNOSIS — B351 Tinea unguium: Secondary | ICD-10-CM

## 2021-07-11 DIAGNOSIS — L84 Corns and callosities: Secondary | ICD-10-CM | POA: Diagnosis not present

## 2021-07-11 DIAGNOSIS — M79675 Pain in left toe(s): Secondary | ICD-10-CM | POA: Diagnosis not present

## 2021-07-11 DIAGNOSIS — M79674 Pain in right toe(s): Secondary | ICD-10-CM

## 2021-07-11 NOTE — Progress Notes (Signed)
Subjective:   Patient ID: Scott Johnson, male   DOB: 75 y.o.   MRN: [id number]   HPI Patient presents with elongated nailbeds 1-5 both feet that get thick and painful on both feet and he cannot cut along with lesion left hallux right second toe   ROS      Objective:  Physical Exam  Mycotic nail infection with thick yellow brittle nailbeds 1-5 both feet with significant severe lesion formation distal left hallux right second toe preulcerative     Assessment:  Foot structural positions creating mycotic nail infection lesion formation     Plan:  H&P reviewed both conditions debrided nailbeds debrided lesions reappoint routine care

## 2021-07-16 ENCOUNTER — Other Ambulatory Visit: Payer: Self-pay

## 2021-07-16 ENCOUNTER — Telehealth: Payer: Self-pay | Admitting: Orthopedic Surgery

## 2021-07-16 MED ORDER — CLINDAMYCIN HCL 150 MG PO CAPS: ORAL_CAPSULE | ORAL | 0 refills | Status: DC

## 2021-07-16 NOTE — Telephone Encounter (Signed)
Patient called needing  Rx for Clindamycin sent to [facility] on [facility]. Patient said he is going to the dentist Wednesday. The number to contact patient is [phone]

## 2021-07-16 NOTE — Telephone Encounter (Signed)
I sent in this refill for patient.

## 2021-07-16 NOTE — Telephone Encounter (Signed)
tyvm

## 2021-11-12 ENCOUNTER — Ambulatory Visit: Payer: BC Managed Care – PPO | Admitting: Podiatry

## 2021-11-12 ENCOUNTER — Other Ambulatory Visit: Payer: Self-pay

## 2021-11-12 ENCOUNTER — Encounter: Payer: Self-pay | Admitting: Podiatry

## 2021-11-12 DIAGNOSIS — M79675 Pain in left toe(s): Secondary | ICD-10-CM

## 2021-11-12 DIAGNOSIS — B351 Tinea unguium: Secondary | ICD-10-CM

## 2021-11-12 DIAGNOSIS — L97521 Non-pressure chronic ulcer of other part of left foot limited to breakdown of skin: Secondary | ICD-10-CM | POA: Diagnosis not present

## 2021-11-12 DIAGNOSIS — L84 Corns and callosities: Secondary | ICD-10-CM

## 2021-11-12 DIAGNOSIS — M79674 Pain in right toe(s): Secondary | ICD-10-CM

## 2021-11-14 NOTE — Progress Notes (Signed)
Subjective:   Patient ID: Scott Johnson, male   DOB: 76 y.o.   MRN: [id number]   HPI Patient presents stating that he has severe callus formation and nail disease with relative poor health long-term diabetes and chronic type tissue   ROS      Objective:  Physical Exam  Neurovascular status intact with severe lesions on the left hallux second digits bilateral that are extremely thick and dystrophic along with nail disease thick yellow brittle nailbeds 1-5 both feet     Assessment:  Chronic mycotic nail infection along with lesion formation bilateral painful with at risk condition and pain     Plan:  Reviewed condition and H&P went ahead today debrided nailbeds 1-5 both feet and lesions no iatrogenic bleeding reappoint routine care

## 2021-12-20 ENCOUNTER — Other Ambulatory Visit: Payer: Self-pay | Admitting: Cardiovascular Disease

## 2021-12-20 NOTE — Telephone Encounter (Signed)
Rx(s) sent to pharmacy electronically.  

## 2022-01-23 ENCOUNTER — Encounter (HOSPITAL_BASED_OUTPATIENT_CLINIC_OR_DEPARTMENT_OTHER): Payer: Self-pay | Admitting: Cardiovascular Disease

## 2022-01-23 ENCOUNTER — Ambulatory Visit (HOSPITAL_BASED_OUTPATIENT_CLINIC_OR_DEPARTMENT_OTHER): Payer: Medicare PPO | Admitting: Cardiovascular Disease

## 2022-01-23 DIAGNOSIS — E7800 Pure hypercholesterolemia, unspecified: Secondary | ICD-10-CM | POA: Diagnosis not present

## 2022-01-23 DIAGNOSIS — Z951 Presence of aortocoronary bypass graft: Secondary | ICD-10-CM | POA: Diagnosis not present

## 2022-01-23 DIAGNOSIS — I10 Essential (primary) hypertension: Secondary | ICD-10-CM

## 2022-01-23 DIAGNOSIS — E669 Obesity, unspecified: Secondary | ICD-10-CM

## 2022-01-23 NOTE — Progress Notes (Deleted)
? ? ?Cardiology Office Note ? ? ?Date:  01/23/2022  ? ?ID:  Scott Johnson, DOB 12/11/1945, MRN [id number] ? ?PCP:  Lawerance Cruel, MD  ?Cardiologist:   Skeet Latch, MD  ? ?No chief complaint on file. ? ?  ?History of Present Illness: ?Scott Johnson is a 76 y.o. 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  He was seen in clinic 05/15/16 and referred for LHC due to classic symptoms of angina.  Scott Johnson had a Prosser 05/21/16 that revealed severe 3 vessel CAD.  He underwent 4 vessel CABG with Dr. Cyndia Bent on 06/13/16.  He had post-operative atrial fibrillation managed with IV amiodarone and successfully converted to sinus rhythm.  This was dicontinued when he followed up with Dr. Cyndia Bent on 09/2016.   ? ?At his last appointment he was doing well and planning to retire.  He was encouraged to increase his exercise.  He was not interested in getting a sleep study. ? ? ?Past Medical History:  ?Diagnosis Date  ? Arthritis   ? Benign prostate hyperplasia   ? takes Proscar daily  ? Colon polyp   ? benign  ? Coronary artery disease   ? Essential hypertension 05/15/2016  ? History of bronchitis   ? many yrs ago  ? Hyperlipidemia   ? takes Zocor daily   ? Hypertension   ? takes Imdur,Metoprolol,and Losartan-HCTZ daily  ? Joint pain   ? ? ?Past Surgical History:  ?Procedure Laterality Date  ? CARDIAC CATHETERIZATION N/A 05/21/2016  ? Procedure: Left Heart Cath and Coronary Angiography;  Surgeon: Leonie Man, MD;  Location: [facility] CV LAB;  Service: Cardiovascular;  Laterality: N/A;  ? COLONOSCOPY    ? CORONARY ARTERY BYPASS GRAFT N/A 06/13/2016  ? Procedure: CORONARY ARTERY BYPASS GRAFTING (CABG)x 4 with endoscopic harvesting of right greater saphenous vein;  Surgeon: Gaye Pollack, MD;  Location: [place] OR;  Service: Open Heart  Surgery;  Laterality: N/A;  ? EYE SURGERY Bilateral 2015  ? Cataract removal  ? TEE WITHOUT CARDIOVERSION N/A 06/13/2016  ? Procedure: TRANSESOPHAGEAL ECHOCARDIOGRAM (TEE);  Surgeon: Gaye Pollack, MD;  Location: [place];  Service: Open Heart Surgery;  Laterality: N/A;  ? 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/26/2015  ? 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/26/2015  ? Procedure: TOTAL KNEE ARTHROPLASTY, CEMENTED;  Surgeon: Meredith Pel, MD;  Location: [place];  Service: Orthopedics;  Laterality: Right;  ? ? ? ?Current Outpatient Medications  ?Medication Sig Dispense Refill  ? aspirin EC 81 MG tablet Take 81 mg by mouth daily.    ? atorvastatin (LIPITOR) 40 MG tablet TAKE 1 TABLET(40 MG) BY MOUTH DAILY AT 6 PM 90 tablet 0  ? Cholecalciferol (VITAMIN D3) 125 MCG (5000 UT) CAPS See admin instructions.    ? clindamycin (CLEOCIN) 150 MG capsule Take 4 capsules 1 hour prior to dental cleaning or dental work. 12 capsule 0  ? finasteride (PROSCAR) 5 MG tablet Take 5 mg by mouth daily at 12 noon.   3  ? hydrochlorothiazide (HYDRODIURIL) 25 MG tablet Take 25 mg by mouth daily.    ? losartan (COZAAR) 100 MG tablet Take 100 mg by mouth daily.    ? ?No current facility-administered medications for this visit.  ? ? ?Allergies:   Penicillins  ? ? ?Social History:  The patient  reports  that he has never smoked. He has never used smokeless tobacco. He reports that he does not drink alcohol and does not use drugs.  ? ?Family History:  The patient's family history includes COPD in his sister; Cancer in his sister; Heart attack in his paternal grandmother; Heart disease in his maternal grandmother; Heart failure in his father, maternal grandfather, and mother; Hypertension in his mother.  ? ? ?ROS:  Please see the history of present illness.   Otherwise, review of systems are positive for none.   All other systems are reviewed and negative.  ? ? ?PHYSICAL EXAM: ?VS:  There were no vitals taken for this visit. , BMI There is no height or weight  on file to calculate BMI. ?GENERAL:  Well appearing ?HEENT: Pupils equal round and reactive, fundi not visualized, oral mucosa unremarkable ?NECK:  No jugular venous distention, waveform within normal limits, carotid upstroke brisk and symmetric, no bruits ?LUNGS:  Clear to auscultation bilaterally ?HEART:  RRR.  PMI not displaced or sustained,S1 and S2 within normal limits, no S3, no S4, no clicks, no rubs, no murmurs ?ABD:  Flat, positive bowel sounds normal in frequency in pitch, no bruits, no rebound, no guarding, no midline pulsatile mass, no hepatomegaly, no splenomegaly ?EXT:  2 plus pulses throughout, no edema, no cyanosis no clubbing ?SKIN:  No rashes no nodules ?NEURO:  Cranial nerves II through XII grossly intact, motor grossly intact throughout ?PSYCH:  Cognitively intact, oriented to person place and time ? ? ?EKG:  EKG is ordered today. ?The ekg ordered 06/26/16 demonstrates sinus rhythm rate 63 bpm ?01/03/17: Sinus rhythm.  Rate 62 bpm. ?12/30/17: Sinus rhythm.  Rate 65 bpm.  LAFB.   ?01/16/2021: Sinus rhythm.  Rate 61 bpm. ? ?[place] 05/21/16: ?Ost LM to LM lesion, 40 %stenosed. ?Ost Cx lesion, 90 %stenosed. ?Ost Ramus lesion, 90 %stenosed. Ost 1st Mrg to 1st Mrg lesion, 90 %stenosed. ?Ost LAD to Prox LAD lesion, 40 %stenosed. Mid LAD lesion, 75 %stenosed. ?Prox nondominant RCA lesion, 90 %stenosed. ?Dist LAD lesion, 40 %stenosed. ?The left ventricular systolic function is normal. LV end diastolic pressure is normal. ? ?Recent Labs: ?No results found for requested labs within last 8760 hours.  ? ?11/21/15 ?Sodium 138, potassium 3.7, BUN 22, creatinine 1.15 ?AST 16, ALT 15 ?Total cholesterol 183, triglycerides 145, HDL 47, LDL 107 ? ?12/03/17: ?Total cholesterol 146, triglycerides 97, HDL 42, LDL 85 ?WBC 9.6, hemoglobin 15.2, hematocrit 44.2, platelets 286 ?Sodium 142, potassium 3.6, BUN 17, creatinine 1.27 ?AST 17, ALT 70 ? ?01/11/2021: ?Total cholesterol 134, triglycerides 105, HDL 42, LDL 73 ?Sodium 139,  potassium 4.1, BUN 26, creatinine 1.5 ?AST 20, ALT 17 ? ? ? ?Lipid Panel ?   ?Component Value Date/Time  ? CHOL 135 04/03/2018 0825  ? TRIG 127 04/03/2018 0825  ? HDL 40 04/03/2018 0825  ? CHOLHDL 3.4 04/03/2018 0825  ? CHOLHDL 3.0 01/03/2017 1437  ? VLDL 17 01/03/2017 1437  ? [place] 70 04/03/2018 0825  ? ?  ? ?Wt Readings from Last 3 Encounters:  ?01/16/21 284 lb (128.8 kg)  ?07/28/20 287 lb (130.2 kg)  ?01/06/20 285 lb (129.3 kg)  ?  ? ? ?ASSESSMENT AND PLAN: ? ?# CAD s/p CABG:  ?# Hyperlipidemia:  ?Mr. Mogul continues to do well.  He is not on a beta blocker due to bradycardia.  Again encouraged to increase exercise to 150 minutes weekly.  Lipids are nearly-controlled on atorvastatin.  Continue 40 mg daily as well as aspirin 81 mg  daily. ? ?# Bradycardia: Asymptomatic. ? ?# Post-operative atrial fibrillation: No recurrent episodes since stopping amiodarone. ? ?# Hypertension: Blood pressure is well-controlled on losartan/HCTZ.   ?Continue atorvastatin.  Lipids will be checked with Dr. Harrington Challenger.  ? ?# Snoring: Patient is not interested in getting a sleep study. ? ?# Obesity: Mr. Rodabaugh was encouraged to start back walking and continued to improve his diet. ? ? ?Current medicines are reviewed at length with the patient today.  The patient does not have concerns regarding medicines. ? ?The following changes have been made:  none ? ?Labs/ tests ordered today include:  ? ?No orders of the defined types were placed in this encounter. ? ? ? ?Disposition:   FU with Jovonda Selner C. Oval Linsey, MD, [facility] in 1 year ? ? ? ?Signed, ?Simuel Stebner C. Oval Linsey, MD, [facility]  ?01/23/2022 8:01 AM    ?[place] ?

## 2022-01-23 NOTE — Progress Notes (Signed)
? ? ?Cardiology Office Note ? ? ?Date:  01/23/2022  ? ?ID:  Scott Johnson, DOB Jan 30, 1946, MRN [id number] ? ?PCP:  Daisy Floro, MD  ?Cardiologist:   Chilton Si, MD  ? ?No chief complaint on file. ? ?  ?History of Present Illness: ?Scott Johnson is a 76 y.o. 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  He was seen in clinic 05/15/16 and referred for LHC due to classic symptoms of angina.  Mr. Fantroy had a LHC 05/21/16 that revealed severe 3 vessel CAD.  He underwent 4 vessel CABG with Dr. Laneta Simmers on 06/13/16.  He had post-operative atrial fibrillation managed with IV amiodarone and successfully converted to sinus rhythm.  This was dicontinued when he followed up with Dr. Laneta Simmers on 09/2016.   ? ?At his last appointment he was doing well and planning to retire.  He was encouraged to increase his exercise.  He was not interested in getting a sleep study. ? ?Today, he reports he has retired. There has been a significant change to his stamina especially during the winter season. He plans to exercise more and improve his stamina during the spring and summer. He adds he was very stressed at the beginning of the year and had high blood pressure readings with systolic around 135-140's. There was an episode of systolic being at 170. ?He had a physical yesterday that showed high triglyceride levels. He recently stopped eating cookies and is trying to manage a healthy diet, eating more protein and vegetables and no snacking after dinner. He has also lost some weight. ?Wt Readings from Last 3 Encounters:  ?01/23/22 280 lb 8 oz (127.2 kg)  ?01/16/21 284 lb (128.8 kg)  ?07/28/20 287 lb (130.2 kg)  ?  ?He is trying to drink more fluids especially water. He denies chest pain with exercise but notes a rapid increase in heart  rate. ?He is not interested in starting weight-loss medications because he wants to follow the diet and exercise route to losing weight. ?He reports tingling and neuropathy. Denies back pain. ? ?He denies chest pain, shortness of breath, palpitations, lightheadedness, headaches, syncope, LE edema, orthopnea, PND.  ? ? ?Past Medical History:  ?Diagnosis Date  ? Arthritis   ? Benign prostate hyperplasia   ? takes Proscar daily  ? Colon polyp   ? benign  ? Coronary artery disease   ? Essential hypertension 05/15/2016  ? History of bronchitis   ? many yrs ago  ? Hyperlipidemia   ? takes Zocor daily   ? Hypertension   ? takes Imdur,Metoprolol,and Losartan-HCTZ daily  ? Joint pain   ? ? ?Past Surgical History:  ?Procedure Laterality Date  ? CARDIAC CATHETERIZATION N/A 05/21/2016  ? Procedure: Left Heart Cath and Coronary Angiography;  Surgeon: Marykay Lex, MD;  Location: [facility] INVASIVE CV LAB;  Service: Cardiovascular;  Laterality: N/A;  ? COLONOSCOPY    ? CORONARY ARTERY BYPASS GRAFT N/A 06/13/2016  ? Procedure: CORONARY ARTERY BYPASS GRAFTING (CABG)x 4 with endoscopic harvesting of right greater saphenous vein;  Surgeon: Alleen Borne, MD;  Location: [facility] OR;  Service: Open Heart Surgery;  Laterality: N/A;  ? EYE SURGERY Bilateral 2015  ? Cataract removal  ? TEE WITHOUT CARDIOVERSION N/A 06/13/2016  ? Procedure: TRANSESOPHAGEAL ECHOCARDIOGRAM (TEE);  Surgeon: Alleen Borne, MD;  Location: [facility] OR;  Service: Open Heart Surgery;  Laterality: N/A;  ? 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/26/2015  ? 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/26/2015  ? Procedure: TOTAL KNEE ARTHROPLASTY, CEMENTED;  Surgeon: Cammy CopaScott Gregory Dean, MD;  Location: [facility] OR;  Service: Orthopedics;  Laterality: Right;  ? ? ? ?Current Outpatient Medications  ?Medication Sig Dispense Refill  ? aspirin EC 81 MG tablet Take 81 mg by mouth daily.    ? atorvastatin (LIPITOR) 40 MG tablet TAKE 1 TABLET(40 MG) BY MOUTH DAILY AT 6 PM 90 tablet 0  ? Cholecalciferol (VITAMIN D3) 125  MCG (5000 UT) CAPS See admin instructions.    ? clindamycin (CLEOCIN) 150 MG capsule Take 4 capsules 1 hour prior to dental cleaning or dental work. 12 capsule 0  ? finasteride (PROSCAR) 5 MG tablet Take 5 mg by mouth daily at 12 noon.   3  ? hydrochlorothiazide (HYDRODIURIL) 25 MG tablet Take 25 mg by mouth daily.    ? losartan (COZAAR) 100 MG tablet Take 100 mg by mouth daily.    ? ?No current facility-administered medications for this visit.  ? ? ?Allergies:   Penicillins  ? ? ?Social History:  The patient  reports that he has never smoked. He has never used smokeless tobacco. He reports that he does not drink alcohol and does not use drugs.  ? ?Family History:  The patient's family history includes COPD in his sister; Cancer in his sister; Heart attack in his paternal grandmother; Heart disease in his maternal grandmother; Heart failure in his father, maternal grandfather, and mother; Hypertension in his mother.  ? ? ?ROS:  Please see the history of present illness.   Otherwise, review of systems are positive for tingling and neuropathy.   All other systems are reviewed and negative.  ? ? ?PHYSICAL EXAM: ?VS:  BP 110/62 (BP Location: Left Arm, Patient Position: Sitting, Cuff Size: Large)   Pulse 62   Ht 6\' 1"  (1.854 m)   Wt 280 lb 8 oz (127.2 kg)   BMI 37.01 kg/m?  , BMI Body mass index is 37.01 kg/m?. ?GENERAL:  Well appearing ?HEENT: Pupils equal round and reactive, fundi not visualized, oral mucosa unremarkable ?NECK:  No jugular venous distention, waveform within normal limits, carotid upstroke brisk and symmetric, no bruits ?LUNGS:  Clear to auscultation bilaterally ?HEART:  RRR.  PMI not displaced or sustained,S1 and S2 within normal limits, no S3, no S4, no clicks, no rubs, no murmurs ?ABD:  Flat, positive bowel sounds normal in frequency in pitch, no bruits, no rebound, no guarding, no midline pulsatile mass, no hepatomegaly, no splenomegaly ?EXT:  2 plus pulses throughout, no edema, no cyanosis no  clubbing ?SKIN:  No rashes no nodules ?NEURO:  Cranial nerves II through XII grossly intact, motor grossly intact throughout ?PSYCH:  Cognitively intact, oriented to person place and time ? ? ?EKG:  EKG is ordered today. ?The ekg ordered 06/26/16 demonstrates sinus rhythm rate 63 bpm ?01/03/17: Sinus rhythm.  Rate 62 bpm. ?12/30/17: Sinus rhythm.  Rate 65 bpm.  LAFB.   ?01/16/2021: Sinus rhythm.  Rate 61 bpm. ?01/23/2022: sinus rhythm Rate- 62 bpm ? ?LHC 05/21/16: ?Ost LM to LM lesion, 40 %stenosed. ?Ost Cx lesion, 90 %stenosed. ?Ost Ramus lesion, 90 %stenosed. Ost 1st Mrg to 1st Mrg lesion, 90 %stenosed. ?Ost LAD to Prox LAD lesion, 40 %stenosed. Mid LAD lesion, 75 %stenosed. ?Prox nondominant RCA lesion, 90 %stenosed. ?Dist LAD lesion, 40 %stenosed. ?The left ventricular systolic function is normal. LV end diastolic pressure  is normal. ? ?Recent Labs: ?No results found for requested labs within last 8760 hours.  ? ?11/21/15 ?Sodium 138, potassium 3.7, BUN 22, creatinine 1.15 ?AST 16, ALT 15 ?Total cholesterol 183, triglycerides 145, HDL 47, LDL 107 ? ?12/03/17: ?Total cholesterol 146, triglycerides 97, HDL 42, LDL 85 ?WBC 9.6, hemoglobin 15.2, hematocrit 44.2, platelets 286 ?Sodium 142, potassium 3.6, BUN 17, creatinine 1.27 ?AST 17, ALT 70 ? ?01/11/2021: ?Total cholesterol 134, triglycerides 105, HDL 42, LDL 73 ?Sodium 139, potassium 4.1, BUN 26, creatinine 1.5 ?AST 20, ALT 17 ? ? ? ?Lipid Panel ?   ?Component Value Date/Time  ? CHOL 135 04/03/2018 0825  ? TRIG 127 04/03/2018 0825  ? HDL 40 04/03/2018 0825  ? CHOLHDL 3.4 04/03/2018 0825  ? CHOLHDL 3.0 01/03/2017 1437  ? VLDL 17 01/03/2017 1437  ? LDLCALC 70 04/03/2018 0825  ? ?  ? ?Wt Readings from Last 3 Encounters:  ?01/23/22 280 lb 8 oz (127.2 kg)  ?01/16/21 284 lb (128.8 kg)  ?07/28/20 287 lb (130.2 kg)  ?  ? ? ?ASSESSMENT AND PLAN: ? ?Obesity (BMI 30-39.9) ?He is trying to lose weight by changing his diet.  He hasn't been very physically active.  We will refer him to  PREP to work on exercise.  He isn't interested in trying a GLP1 at this time.  ? ?Essential hypertension ?Blood pressure is well-controlled.  Continue losartan and HCTZ.   ? ?S/P CABG x 4 ?S/p CABG.  LDL is w

## 2022-01-23 NOTE — Patient Instructions (Addendum)
Medication Instructions:  ?Your physician recommends that you continue on your current medications as directed. Please refer to the Current Medication list given to you today.  ?*If you need a refill on your cardiac medications before your next appointment, please call your pharmacy* ? ?Lab Work: ?NONE ? ?Testing/Procedures: ?NONE ? ?Follow-Up: ?At [facility], you and your health needs are our priority.  As part of our continuing mission to provide you with exceptional heart care, we have created designated Provider Care Teams.  These Care Teams include your primary Cardiologist (physician) and Advanced Practice Providers (APPs -  Physician Assistants and Nurse Practitioners) who all work together to provide you with the care you need, when you need it. ? ?We recommend signing up for the patient portal called "MyChart".  Sign up information is provided on this After Visit Summary.  MyChart is used to connect with patients for Virtual Visits (Telemedicine).  Patients are able to view lab/test results, encounter notes, upcoming appointments, etc.  Non-urgent messages can be sent to your provider as well.   ?To learn more about what you can do with MyChart, go to [email or web address].   ? ?Your next appointment:   ?12 month(s) ? ?The format for your next appointment:   ?In Person ? ?Provider:   ?Chilton Si, MD{ ? ?Other Instructions ?YOU HAVE BEEN REFERRED TO PREP ([facility]) PROGRAM. IF YOU DO NOT HEAR FROM [facility] OR [facility] IN 2 WEEKS CALL THE OFFICE TO FOLLOW UP  ?

## 2022-01-23 NOTE — Assessment & Plan Note (Signed)
S/p CABG.  LDL is well-controlled but triglycerides are elevated.  He is working on limiting carbohydrates and will reassess with his PCP.  LDL goal <70.  Goal triglycerides <150. ?

## 2022-01-23 NOTE — Assessment & Plan Note (Signed)
Blood pressure is well-controlled.  Continue losartan and HCTZ.   ?

## 2022-01-23 NOTE — Assessment & Plan Note (Signed)
He is trying to lose weight by changing his diet.  He hasn't been very physically active.  We will refer him to PREP to work on exercise.  He isn't interested in trying a GLP1 at this time.  ?

## 2022-01-23 NOTE — Assessment & Plan Note (Signed)
Continue atorvastatin

## 2022-01-25 ENCOUNTER — Telehealth: Payer: Self-pay

## 2022-01-25 NOTE — Telephone Encounter (Signed)
Called re: PREP program referral; left voicemail 

## 2022-02-27 ENCOUNTER — Ambulatory Visit (INDEPENDENT_AMBULATORY_CARE_PROVIDER_SITE_OTHER): Payer: Medicare PPO

## 2022-02-27 ENCOUNTER — Ambulatory Visit: Payer: Medicare PPO | Admitting: Podiatry

## 2022-02-27 ENCOUNTER — Encounter: Payer: Self-pay | Admitting: Podiatry

## 2022-02-27 DIAGNOSIS — L97521 Non-pressure chronic ulcer of other part of left foot limited to breakdown of skin: Secondary | ICD-10-CM

## 2022-02-27 DIAGNOSIS — L03032 Cellulitis of left toe: Secondary | ICD-10-CM | POA: Diagnosis not present

## 2022-02-27 DIAGNOSIS — B351 Tinea unguium: Secondary | ICD-10-CM

## 2022-02-27 DIAGNOSIS — L02612 Cutaneous abscess of left foot: Secondary | ICD-10-CM

## 2022-02-27 DIAGNOSIS — M79675 Pain in left toe(s): Secondary | ICD-10-CM | POA: Diagnosis not present

## 2022-02-27 DIAGNOSIS — M79674 Pain in right toe(s): Secondary | ICD-10-CM | POA: Diagnosis not present

## 2022-02-27 MED ORDER — CIPROFLOXACIN HCL 500 MG PO TABS: 500.0000 mg | ORAL_TABLET | Freq: Two times a day (BID) | ORAL | 0 refills | Status: AC

## 2022-02-28 NOTE — Progress Notes (Signed)
Subjective:  ? ?Patient ID: Scott Johnson, male   DOB: 76 y.o.   MRN: [id number]  ? ?HPI ?Patient states over the last few days he has started develop increased pain in his left hallux where he is got a large lesion formation.  States that the toes been red and he has run a fever for couple days even though it is normal now.  Patient also has nail disease 1-5 both feet that he cannot take care of himself and they get thick and incurvated with pain ? ? ?ROS ? ? ?   ?Objective:  ?Physical Exam  ?Erythema in the distal joint of the left hallux with large lesion noted distally along with nail disease 1-5 both feet that are thick incurvated painful to press ? ?   ?Assessment:  ?Probability for distal abscess of the left hallux along with mycotic nail infection with pain 1-5 both feet ? ?   ?Plan:  ?8 NP reviewed condition.  Anesthesia administered left sterile opening of the area and I noted a thick purulent fluid with odor associated with it.  I went ahead today and I evacuated this I did do a culture of this and send it off for culture and sensitivity I then was able to flush out the entire area applied sterile dressing placed and surgical shoe gave instructions for soaks and placed on Cipro twice daily due to the nature of the purulence and his allergies to all penicillin type agents.  Patient was given strict instructions to check his temperature on a daily basis and if he gets any proximal signs of infection or any systemic signs of infection he is to go straight to the emergency room.  I then debrided remaining nails 1-5 both feet placed him in a surgical shoe gave him again strict instructions on what to do and he will be seen back for recheck ? ?X-rays indicate that there is no signs of osteolysis there is some soft tissue stress but I did not note bone condition at this point ?   ? ? ?

## 2022-03-03 LAB — WOUND CULTURE
MICRO NUMBER:: 13377591
SPECIMEN QUALITY:: ADEQUATE

## 2022-03-13 ENCOUNTER — Ambulatory Visit: Payer: BC Managed Care – PPO | Admitting: Podiatry

## 2022-03-25 ENCOUNTER — Other Ambulatory Visit: Payer: Self-pay | Admitting: Cardiovascular Disease

## 2022-03-25 NOTE — Telephone Encounter (Signed)
Rx request sent to pharmacy.  

## 2022-04-30 DIAGNOSIS — H9313 Tinnitus, bilateral: Secondary | ICD-10-CM | POA: Diagnosis not present

## 2022-04-30 DIAGNOSIS — H903 Sensorineural hearing loss, bilateral: Secondary | ICD-10-CM | POA: Diagnosis not present

## 2022-05-29 ENCOUNTER — Ambulatory Visit: Payer: Medicare PPO | Admitting: Podiatry

## 2022-06-05 ENCOUNTER — Ambulatory Visit: Payer: Medicare PPO | Admitting: Podiatry

## 2022-06-05 DIAGNOSIS — L97521 Non-pressure chronic ulcer of other part of left foot limited to breakdown of skin: Secondary | ICD-10-CM

## 2022-06-05 DIAGNOSIS — L84 Corns and callosities: Secondary | ICD-10-CM

## 2022-06-05 NOTE — Progress Notes (Signed)
Subjective:   Patient ID: Scott Johnson, male   DOB: 76 y.o.   MRN: [id number]   HPI Patient presents with chronic lesions stating they have not ulcerated and he is trying to get to them before they do   ROS      Objective:  Physical Exam  Neurovascular status intact chronic keratotic tissue distal's first hallux left and underneath the big toe of both feet with pain     Assessment:  Chronic ulceration which is doing better with routine treatment and trimming with pain and keratotic tissue     Plan:  Sterile debridement of 3 separate lesions no angiogenic bleeding except for the left hallux which was slight and I did apply sterile dressing with Neosporin and reappoint for routine care 3 months or earlier if necessary

## 2022-07-23 DIAGNOSIS — Z86010 Personal history of colonic polyps: Secondary | ICD-10-CM | POA: Diagnosis not present

## 2022-07-23 DIAGNOSIS — K648 Other hemorrhoids: Secondary | ICD-10-CM | POA: Diagnosis not present

## 2022-07-23 DIAGNOSIS — Z09 Encounter for follow-up examination after completed treatment for conditions other than malignant neoplasm: Secondary | ICD-10-CM | POA: Diagnosis not present

## 2022-07-23 DIAGNOSIS — K5730 Diverticulosis of large intestine without perforation or abscess without bleeding: Secondary | ICD-10-CM | POA: Diagnosis not present

## 2022-09-11 ENCOUNTER — Encounter: Payer: Self-pay | Admitting: Podiatry

## 2022-09-11 ENCOUNTER — Ambulatory Visit: Payer: Medicare PPO | Admitting: Podiatry

## 2022-09-11 DIAGNOSIS — L84 Corns and callosities: Secondary | ICD-10-CM

## 2022-09-14 NOTE — Progress Notes (Signed)
Subjective:   Patient ID: Scott Johnson, male   DOB: 76 y.o.   MRN: [id number]   HPI Patient presents with painful lesions both feet fifth and first metatarsal that are sore and make it hard to walk along with nails that become thickened and are difficult for patient to cut   ROS      Objective:  Physical Exam  Neurovascular status unchanged thick keratotic lesions bilateral with lucent cores that are painful along with nail disease with pain     Assessment:  Chronic lesion formation bilateral along with nail disease     Plan:  Sharp sterile debridement bilateral no angiogenic bleeding courtesy debridement nailbeds reappoint routine care

## 2022-10-07 DIAGNOSIS — J101 Influenza due to other identified influenza virus with other respiratory manifestations: Secondary | ICD-10-CM | POA: Diagnosis not present

## 2022-10-07 DIAGNOSIS — Z6838 Body mass index (BMI) 38.0-38.9, adult: Secondary | ICD-10-CM | POA: Diagnosis not present

## 2022-10-07 DIAGNOSIS — R509 Fever, unspecified: Secondary | ICD-10-CM | POA: Diagnosis not present

## 2022-10-07 DIAGNOSIS — R051 Acute cough: Secondary | ICD-10-CM | POA: Diagnosis not present

## 2022-11-29 DIAGNOSIS — R972 Elevated prostate specific antigen [PSA]: Secondary | ICD-10-CM | POA: Diagnosis not present

## 2022-11-29 DIAGNOSIS — R351 Nocturia: Secondary | ICD-10-CM | POA: Diagnosis not present

## 2022-11-29 DIAGNOSIS — N401 Enlarged prostate with lower urinary tract symptoms: Secondary | ICD-10-CM | POA: Diagnosis not present

## 2022-12-12 ENCOUNTER — Encounter: Payer: Self-pay | Admitting: Podiatry

## 2022-12-12 ENCOUNTER — Ambulatory Visit: Payer: Medicare PPO | Admitting: Podiatry

## 2022-12-12 DIAGNOSIS — M2042 Other hammer toe(s) (acquired), left foot: Secondary | ICD-10-CM | POA: Diagnosis not present

## 2022-12-12 DIAGNOSIS — L84 Corns and callosities: Secondary | ICD-10-CM

## 2022-12-12 DIAGNOSIS — M79674 Pain in right toe(s): Secondary | ICD-10-CM

## 2022-12-12 DIAGNOSIS — M79675 Pain in left toe(s): Secondary | ICD-10-CM | POA: Diagnosis not present

## 2022-12-12 DIAGNOSIS — M2041 Other hammer toe(s) (acquired), right foot: Secondary | ICD-10-CM | POA: Diagnosis not present

## 2022-12-12 DIAGNOSIS — B351 Tinea unguium: Secondary | ICD-10-CM | POA: Diagnosis not present

## 2022-12-12 NOTE — Progress Notes (Signed)
Subjective:   Patient ID: Scott Johnson, male   DOB: 77 y.o.   MRN: [id number]   HPI Patient presents with severe nail disease and severe calluses of the hallux second digits bilateral that are very painful and make it hard to wear shoe gear comfortably.  States he knows the toes are in bad position with history of ulceration   ROS      Objective:  Physical Exam  Neurovascular status was found to be intact severe keratotic lesion hallux bilateral second digit bilateral very painful when pressed thick with history of ulceration and thick yellow brittle nailbeds 1-5 both feet painful     Assessment:  Digital deformities chronic hammertoe deformity with pressure against the distal portions of the toe along with callus formation mycotic nail infection     Plan:  H&P all conditions reviewed discussed digital correction would like to avoid this and did courtesy debridement of lesions but they should be done on a routine basis to keep from ulcerating.  Patient will reappoint 3 months for debridement of lesions and today I debrided nailbeds 1-5 both feet no angiogenic bleeding noted

## 2022-12-18 ENCOUNTER — Other Ambulatory Visit: Payer: Self-pay | Admitting: Cardiovascular Disease

## 2022-12-25 DIAGNOSIS — H26493 Other secondary cataract, bilateral: Secondary | ICD-10-CM | POA: Diagnosis not present

## 2022-12-25 DIAGNOSIS — Z961 Presence of intraocular lens: Secondary | ICD-10-CM | POA: Diagnosis not present

## 2022-12-25 DIAGNOSIS — H18053 Posterior corneal pigmentations, bilateral: Secondary | ICD-10-CM | POA: Diagnosis not present

## 2022-12-25 DIAGNOSIS — H35371 Puckering of macula, right eye: Secondary | ICD-10-CM | POA: Diagnosis not present

## 2023-01-22 DIAGNOSIS — I10 Essential (primary) hypertension: Secondary | ICD-10-CM | POA: Diagnosis not present

## 2023-01-22 DIAGNOSIS — E559 Vitamin D deficiency, unspecified: Secondary | ICD-10-CM | POA: Diagnosis not present

## 2023-01-22 DIAGNOSIS — Z125 Encounter for screening for malignant neoplasm of prostate: Secondary | ICD-10-CM | POA: Diagnosis not present

## 2023-01-22 DIAGNOSIS — E782 Mixed hyperlipidemia: Secondary | ICD-10-CM | POA: Diagnosis not present

## 2023-01-29 DIAGNOSIS — E559 Vitamin D deficiency, unspecified: Secondary | ICD-10-CM | POA: Diagnosis not present

## 2023-01-29 DIAGNOSIS — E782 Mixed hyperlipidemia: Secondary | ICD-10-CM | POA: Diagnosis not present

## 2023-01-29 DIAGNOSIS — Z0000 Encounter for general adult medical examination without abnormal findings: Secondary | ICD-10-CM | POA: Diagnosis not present

## 2023-01-29 DIAGNOSIS — N1831 Chronic kidney disease, stage 3a: Secondary | ICD-10-CM | POA: Diagnosis not present

## 2023-01-29 DIAGNOSIS — Z125 Encounter for screening for malignant neoplasm of prostate: Secondary | ICD-10-CM | POA: Diagnosis not present

## 2023-01-29 DIAGNOSIS — R7301 Impaired fasting glucose: Secondary | ICD-10-CM | POA: Diagnosis not present

## 2023-01-29 DIAGNOSIS — Z6838 Body mass index (BMI) 38.0-38.9, adult: Secondary | ICD-10-CM | POA: Diagnosis not present

## 2023-01-29 DIAGNOSIS — I10 Essential (primary) hypertension: Secondary | ICD-10-CM | POA: Diagnosis not present

## 2023-02-11 ENCOUNTER — Ambulatory Visit (HOSPITAL_BASED_OUTPATIENT_CLINIC_OR_DEPARTMENT_OTHER): Payer: Medicare PPO | Admitting: Cardiovascular Disease

## 2023-02-11 ENCOUNTER — Encounter (HOSPITAL_BASED_OUTPATIENT_CLINIC_OR_DEPARTMENT_OTHER): Payer: Self-pay | Admitting: Cardiovascular Disease

## 2023-02-11 VITALS — BP 110/60 | HR 69 | Ht 73.0 in | Wt 284.7 lb

## 2023-02-11 DIAGNOSIS — I209 Angina pectoris, unspecified: Secondary | ICD-10-CM

## 2023-02-11 DIAGNOSIS — I10 Essential (primary) hypertension: Secondary | ICD-10-CM

## 2023-02-11 DIAGNOSIS — R0683 Snoring: Secondary | ICD-10-CM | POA: Diagnosis not present

## 2023-02-11 DIAGNOSIS — R400 Somnolence: Secondary | ICD-10-CM | POA: Diagnosis not present

## 2023-02-11 DIAGNOSIS — Z951 Presence of aortocoronary bypass graft: Secondary | ICD-10-CM

## 2023-02-11 HISTORY — DX: Snoring: R06.83

## 2023-02-11 NOTE — Assessment & Plan Note (Signed)
Encouraged him to work on increasing exercise as above.  He is doing a good job of trying to limit his carbohydrate intake.  Refer to prep as above.

## 2023-02-11 NOTE — Patient Instructions (Addendum)
Medication Instructions:  Your physician recommends that you continue on your current medications as directed. Please refer to the Current Medication list given to you today.  *If you need a refill on your cardiac medications before your next appointment, please call your pharmacy*  Lab Work: NONE  Testing/Procedures: [facility] HOME SLEEP STUDY  THE OFFICE WILL CALL YOU WITH ACTIVATION CODE   Follow-Up: At [facility], you and your health needs are our priority.  As part of our continuing mission to provide you with exceptional heart care, we have created designated Provider Care Teams.  These Care Teams include your primary Cardiologist (physician) and Advanced Practice Providers (APPs -  Physician Assistants and Nurse Practitioners) who all work together to provide you with the care you need, when you need it.  We recommend signing up for the patient portal called "MyChart".  Sign up information is provided on this After Visit Summary.  MyChart is used to connect with patients for Virtual Visits (Telemedicine).  Patients are able to view lab/test results, encounter notes, upcoming appointments, etc.  Non-urgent messages can be sent to your provider as well.   To learn more about what you can do with MyChart, go to [email or web address].    Your next appointment:   12 month(s)  Provider:   Chilton Si, MD or Gillian Shields, NP    You have been referred to PREP PROGRAM  IF YOU DO NOT HEAR FROM THEM IN 2 WEEKS CALL THEM DIRECTLY AT HIGHLIGHTED NUMBER   Other Instructions WatchPAT?  Is a FDA cleared portable home sleep study test that uses a watch and 3 points of contact to monitor 7 different channels, including your heart rate, oxygen saturations, body position, snoring, and chest motion.  The study is easy to use from the comfort of your own home and accurately detect sleep apnea.  Before bed, you attach the chest sensor, attached the sleep apnea bracelet to your  nondominant hand, and attach the finger probe.  After the study, the raw data is downloaded from the watch and scored for apnea events.   For more information: [email or web address]  Patient Testing Instructions:  Do not put battery into the device until bedtime when you are ready to begin the test. Please call the support number if you need assistance after following the instructions below: 24 hour support line- [phone] or [facility] support at [phone] (option 2)  Download the IntelWatchPAT One" app through the google play store or App Store  Be sure to turn on or enable access to bluetooth in settlings on your smartphone/ device  Make sure no other bluetooth devices are on and within the vicinity of your smartphone/ device and WatchPAT watch during testing.  Make sure to leave your smart phone/ device plugged in and charging all night.  When ready for bed:  Follow the instructions step by step in the WatchPAT One App to activate the testing device. For additional instructions, including video instruction, visit the WatchPAT One video on Youtube. You can search for WatchPat One within Youtube (video is 4 minutes and 18 seconds) or enter: https://youtube/watch?v=BCce_vbiwxE Please note: You will be prompted to enter a Pin to connect via bluetooth when starting the test. The PIN will be assigned to you when you receive the test.  The device is disposable, but it recommended that you retain the device until you receive a call letting you know the study has been received and the results have been interpreted.  We will let you  know if the study did not transmit to [place] properly after the test is completed. You do not need to call us to confirm the receipt of the test.  Please complete the test within 48 hours of receiving PIN.   Frequently Asked Questions:  What is Watch Dennie Bible one?  A single use fully disposable home sleep apnea testing device and will not need to be returned  after completion.  What are the requirements to use WatchPAT one?  The be able to have a successful watchpat one sleep study, you should have your Watch pat one device, your smart phone, watch pat one app, your PIN number and Internet access What type of phone do I need?  You should have a smart phone that uses Android 5.1 and above or any Iphone with IOS 10 and above How can I download the WatchPAT one app?  Based on your device type search for WatchPAT one app either in google play for android devices or APP store for Iphone's Where will I get my PIN for the study?  Your PIN will be provided by your physician's office. It is used for authentication and if you lose/forget your PIN, please reach out to your providers office.  I do not have Internet at home. Can I do WatchPAT one study?  WatchPAT One needs Internet connection throughout the night to be able to transmit the sleep data. You can use your home/local internet or your cellular's data package. However, it is always recommended to use home/local Internet. It is estimated that between 20MB-30MB will be used with each study.However, the application will be looking for space in the phone to start the study.  What happens if I lose internet or bluetooth connection?  During the internet disconnection, your phone will not be able to transmit the sleep data. All the data, will be stored in your phone. As soon as the internet connection is back on, the phone will being sending the sleep data. During the bluetooth disconnection, WatchPAT one will not be able to to send the sleep data to your phone. Data will be kept in the [facility] one until two devices have bluetooth connection back on. As soon as the connection is back on, WatchPAT one will send the sleep data to the phone.  How long do I need to wear the WatchPAT one?  After you start the study, you should wear the device at least 6 hours.  How far should I keep my phone from the device?   During the night, your phone should be within 15 feet.  What happens if I leave the room for restroom or other reasons?  Leaving the room for any reason will not cause any problem. As soon as your get back to the room, both devices will reconnect and will continue to send the sleep data. Can I use my phone during the sleep study?  Yes, you can use your phone as usual during the study. But it is recommended to put your watchpat one on when you are ready to go to bed.  How will I get my study results?  A soon as you completed your study, your sleep data will be sent to the provider. They will then share the results with you when they are ready.

## 2023-02-11 NOTE — Progress Notes (Signed)
Cardiology Office Note   Date:  02/11/2023   ID:  Scott Johnson, DOB Jun 17, 1946, MRN [id number]  PCP:  Daisy Floro, MD  Cardiologist:   Chilton Si, MD   No chief complaint on file.    History of Present Illness: Scott Johnson is a 77 y.o. 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  He was seen in clinic 05/15/16 and referred for LHC due to classic symptoms of angina.  Scott Johnson had a LHC 05/21/16 that revealed severe 3 vessel CAD.  He underwent 4 vessel CABG with Dr. Laneta Simmers on 06/13/16.  He had post-operative atrial fibrillation managed with IV amiodarone and successfully converted to sinus rhythm.  This was dicontinued when he followed up with Dr. Laneta Simmers on 09/2016.    Today, the patient states that his biggest issue is stamina. He had gone to visit [place] and had trouble walk due to weakness and fatigue however he denies any chest pain or shortness of breath. He had the flu just before his trip. Prior to the trip he wasn't getting much exercise other than walking in his home. He sometimes feels irregular heart beats when sitting at the computer. He purchased a Kardia mobile to try and catch it. The feeling only lasts for moments and he describes it as a racing. He believes that he snores but feels rested when he wakes up in the morning.   He denies any chest pain, shortness of breath, or peripheral edema. No lightheadedness, headaches, syncope, orthopnea, or PND.  Past Medical History:  Diagnosis Date   Arthritis    Benign prostate hyperplasia    takes Proscar daily   Colon polyp    benign   Coronary artery disease    Essential hypertension 05/15/2016   History of bronchitis    many yrs ago   Hyperlipidemia    takes Zocor daily    Hypertension    takes  Imdur,Metoprolol,and Losartan-HCTZ daily   Joint pain    Snoring 02/11/2023    Past Surgical History:  Procedure Laterality Date   CARDIAC CATHETERIZATION N/A 05/21/2016   Procedure: Left Heart Cath and Coronary Angiography;  Surgeon: Marykay Lex, MD;  Location: [facility] INVASIVE CV LAB;  Service: Cardiovascular;  Laterality: N/A;   COLONOSCOPY     CORONARY ARTERY BYPASS GRAFT N/A 06/13/2016   Procedure: CORONARY ARTERY BYPASS GRAFTING (CABG)x 4 with endoscopic harvesting of right greater saphenous vein;  Surgeon: Alleen Borne, MD;  [facility] OR;  Service: Open Heart Surgery;  Laterality: N/A;   EYE SURGERY Bilateral 2015   Cataract removal   TEE WITHOUT CARDIOVERSION N/A 06/13/2016   Procedure: TRANSESOPHAGEAL ECHOCARDIOGRAM (TEE);  Surgeon: Alleen Borne, MD;  Location: [facility] OR;  Service: Open Heart Surgery;  Laterality: N/A;   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/26/2015   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/26/2015   Procedure: TOTAL KNEE ARTHROPLASTY, CEMENTED;  Surgeon: Cammy Copa, MD;  Location: [facility] OR;  Service: Orthopedics;  Laterality: Right;     Current Outpatient Medications  Medication Sig Dispense Refill   aspirin EC 81 MG tablet Take 81 mg by mouth daily.     atorvastatin (LIPITOR) 40 MG tablet TAKE 1 TABLET(40 MG) BY MOUTH DAILY AT 6 PM 90 tablet 2   Cholecalciferol (VITAMIN D3) 125 MCG (5000 UT) CAPS See  admin instructions.     clindamycin (CLEOCIN) 150 MG capsule Take 4 capsules 1 hour prior to dental cleaning or dental work. 12 capsule 0   finasteride (PROSCAR) 5 MG tablet Take 5 mg by mouth daily at 12 noon.   3   hydrochlorothiazide (HYDRODIURIL) 25 MG tablet Take 25 mg by mouth daily.     losartan (COZAAR) 100 MG tablet Take 100 mg by mouth daily.     No current facility-administered medications for this visit.    Allergies:   Penicillins    Social History:  The patient  reports that he has never smoked. He has never used smokeless tobacco. He reports that he  does not drink alcohol and does not use drugs.   Family History:  The patient's family history includes COPD in his sister; Cancer in his sister; Heart attack in his paternal grandmother; Heart disease in his maternal grandmother; Heart failure in his father, maternal grandfather, and mother; Hypertension in his mother.    ROS:  Please see the history of present illness.    (+) Weakness (+) Palpitations  All other systems are reviewed and negative.    PHYSICAL EXAM: VS:  BP 110/60 (BP Location: Left Arm, Patient Position: Sitting, Cuff Size: Large)   Pulse 69   Ht 6\' 1"  (1.854 m)   Wt 284 lb 11.2 oz (129.1 kg)   BMI 37.56 kg/m  , BMI Body mass index is 37.56 kg/m. GENERAL:  Well appearing HEENT: Pupils equal round and reactive, fundi not visualized, oral mucosa unremarkable NECK:  No jugular venous distention, waveform within normal limits, carotid upstroke brisk and symmetric, no bruits, no thyromegaly LUNGS:  Clear to auscultation bilaterally HEART:  RRR.  PMI not displaced or sustained,S1 and S2 within normal limits, no S3, no S4, no clicks, no rubs, no murmurs ABD:  Flat, positive bowel sounds normal in frequency in pitch, no bruits, no rebound, no guarding, no midline pulsatile mass, no hepatomegaly, no splenomegaly EXT:  2 plus pulses throughout, no edema, no cyanosis no clubbing SKIN:  No rashes no nodules NEURO:  Cranial nerves II through XII grossly intact, motor grossly intact throughout PSYCH:  Cognitively intact, oriented to person place and time  EKG:  EKG is personally reviewed 02/11/2023: Normal rhythm rate 69 bpm. 06/26/16 Sinus rhythm rate 63 bpm 01/03/17: Sinus rhythm.  Rate 62 bpm. 12/30/17: Sinus rhythm.  Rate 65 bpm.  LAFB.   01/16/2021: Sinus rhythm.  Rate 61 bpm. 01/23/2022: sinus rhythm Rate- 62 bpm  LHC 05/21/16: Ost LM to LM lesion, 40 %stenosed. Ost Cx lesion, 90 %stenosed. Ost Ramus lesion, 90 %stenosed. Ost 1st Mrg to 1st Mrg lesion, 90 %stenosed. Ost LAD  to Prox LAD lesion, 40 %stenosed. Mid LAD lesion, 75 %stenosed. Prox nondominant RCA lesion, 90 %stenosed. Dist LAD lesion, 40 %stenosed. The left ventricular systolic function is normal. LV end diastolic pressure is normal.  Recent Labs: No results found for requested labs within last 365 days.   11/21/15 Sodium 138, potassium 3.7, BUN 22, creatinine 1.15 AST 16, ALT 15 Total cholesterol 183, triglycerides 145, HDL 47, LDL 107  12/03/17: Total cholesterol 146, triglycerides 97, HDL 42, LDL 85 WBC 9.6, hemoglobin 15.2, hematocrit 44.2, platelets 286 Sodium 142, potassium 3.6, BUN 17, creatinine 1.27 AST 17, ALT 70  01/11/2021: Total cholesterol 134, triglycerides 105, HDL 42, LDL 73 Sodium 139, potassium 4.1, BUN 26, creatinine 1.5 AST 20, ALT 17    Lipid Panel    Component Value Date/Time  CHOL 135 04/03/2018 0825   TRIG 127 04/03/2018 0825   HDL 40 04/03/2018 0825   CHOLHDL 3.4 04/03/2018 0825   CHOLHDL 3.0 01/03/2017 1437   VLDL 17 01/03/2017 1437   LDLCALC 70 04/03/2018 0825      Wt Readings from Last 3 Encounters:  02/11/23 284 lb 11.2 oz (129.1 kg)  01/23/22 280 lb 8 oz (127.2 kg)  01/16/21 284 lb (128.8 kg)      ASSESSMENT AND PLAN:  Essential hypertension Blood sugars very well-controlled.  Continue losartan and HCTZ.  S/P CABG x 4 S/p CABG.  He has no exertional chest pain but isn't exercising regularly.  Encouraged him to work on increasing his exercise.  We have referred him to the PREP program at the [facility].  Continue aspirin and atorvastatin.  LDL goal less than 70.  Obesity (BMI 30-39.9) Encouraged him to work on increasing exercise as above.  He is doing a good job of trying to limit his carbohydrate intake.  Refer to prep as above.  Snoring He reports a history of snoring and palpitations.  He also has some daytime somnolence.  We will check an Itamar home sleep study.   Current medicines are reviewed at length with the patient today.  The  patient does not have concerns regarding medicines.  The following changes have been made:  start prep program.  Labs/ tests ordered today include:   Orders Placed This Encounter  Procedures   Amb Referral To Provider Referral Exercise Program (P.R.E.P)   EKG 12-Lead   Itamar Sleep Study   Disposition:   FU with Psalm Arman C. Duke Salvia, MD, [facility] in 1 year   I,Coren O'Brien,acting as a scribe for Chilton Si, MD.,have documented all relevant documentation on the behalf of Chilton Si, MD,as directed by  Chilton Si, MD while in the presence of Chilton Si, MD.  I, Vivion Romano C. Duke Salvia, MD have reviewed all documentation for this visit.  The documentation of the exam, diagnosis, procedures, and orders on 02/11/2023 are all accurate and complete.

## 2023-02-11 NOTE — Assessment & Plan Note (Signed)
S/p CABG.  He has no exertional chest pain but isn't exercising regularly.  Encouraged him to work on increasing his exercise.  We have referred him to the PREP program at the [facility].  Continue aspirin and atorvastatin.  LDL goal less than 70.

## 2023-02-11 NOTE — Assessment & Plan Note (Signed)
Blood sugars very well-controlled.  Continue losartan and HCTZ.

## 2023-02-11 NOTE — Assessment & Plan Note (Deleted)
S/p CABG.  He has no exertional chest pain but isn't exercising regularly.  Encouraged him to work on increasing his exercise.  We have referred him to the PREP program at the [facility].

## 2023-02-11 NOTE — Assessment & Plan Note (Signed)
He reports a history of snoring and palpitations.  He also has some daytime somnolence.  We will check an Itamar home sleep study.

## 2023-02-12 ENCOUNTER — Telehealth: Payer: Self-pay | Admitting: *Deleted

## 2023-02-12 NOTE — Telephone Encounter (Signed)
Regis Bill notified ok to activate itamar device. Patient's insurance does not require a PA.

## 2023-02-12 NOTE — Telephone Encounter (Signed)
Mychart message sent to patient.

## 2023-02-13 ENCOUNTER — Encounter (INDEPENDENT_AMBULATORY_CARE_PROVIDER_SITE_OTHER): Payer: Medicare PPO | Admitting: Cardiology

## 2023-02-13 DIAGNOSIS — G4733 Obstructive sleep apnea (adult) (pediatric): Secondary | ICD-10-CM | POA: Diagnosis not present

## 2023-02-14 ENCOUNTER — Ambulatory Visit: Payer: Medicare PPO | Attending: Cardiovascular Disease

## 2023-02-14 ENCOUNTER — Telehealth: Payer: Self-pay

## 2023-02-14 ENCOUNTER — Encounter (HOSPITAL_BASED_OUTPATIENT_CLINIC_OR_DEPARTMENT_OTHER): Payer: Self-pay

## 2023-02-14 DIAGNOSIS — R400 Somnolence: Secondary | ICD-10-CM

## 2023-02-14 DIAGNOSIS — I10 Essential (primary) hypertension: Secondary | ICD-10-CM

## 2023-02-14 DIAGNOSIS — R0683 Snoring: Secondary | ICD-10-CM

## 2023-02-14 NOTE — Telephone Encounter (Signed)
Called to discuss PREP program referral; explained program, he would like to attend [facility] June class; will contact him mid-May to set up assessment visit

## 2023-02-14 NOTE — Procedures (Signed)
SLEEP STUDY REPORT Patient Information Study Date: 02/13/2023 Patient Name: Scott Johnson Patient ID: [id number] Birth Date: Dec 06, 1945 Age: 77 Gender: Male BMI: 37.7 (W=284 lb, H=6' 1'') Referring Physician: Chilton Si, MD  TEST DESCRIPTION: Home sleep apnea testing was completed using the WatchPat, a Type 1 device, utilizing peripheral arterial tonometry (PAT), chest movement, actigraphy, pulse oximetry, pulse rate, body position and snore. AHI was calculated with apnea and hypopnea using valid sleep time as the denominator. RDI includes apneas, hypopneas, and RERAs. The data acquired and the scoring of sleep and all associated events were performed in accordance with the recommended standards and specifications as outlined in the AASM Manual for the Scoring of Sleep and Associated Events 2.2.0 (2015).   FINDINGS:   1. Mild Obstructive Sleep Apnea with AHI 10.5/hr.   2. No Central Sleep Apnea with pAHIc 0.6/hr.   3. Oxygen desaturations as low as 87%.   4. Moderate snoring was present. O2 sats were < 88% for 0.6 min.   5. Total sleep time was 6 hrs and 40 min.   6. 11.3% of total sleep time was spent in REM sleep.   7. Normal sleep onset latency at 10 min.   8. Shortened REM sleep onset latency at 75 min.   9. Total awakenings were 11.  10. Arrhythmia detection:  None  DIAGNOSIS: Mild Obstructive Sleep Apnea (G47.33)  RECOMMENDATIONS:   1.  Clinical correlation of these findings is necessary.  The decision to treat obstructive sleep apnea (OSA) is usually based on the presence of apnea symptoms or the presence of associated medical conditions such as Hypertension, Congestive Heart Failure, Atrial Fibrillation or Obesity.  The most common symptoms of OSA are snoring, gasping for breath while sleeping, daytime sleepiness and fatigue.   2.  Initiating apnea therapy is recommended given the presence of symptoms and/or associated conditions. Recommend proceeding  with one of the following:     a.  Auto-CPAP therapy with a pressure range of 5-20cm H2O.     b.  An oral appliance (OA) that can be obtained from certain dentists with expertise in sleep medicine.  These are primarily of use in non-obese patients with mild and moderate disease.     c.  An ENT consultation which may be useful to look for specific causes of obstruction and possible treatment options.     d.  If patient is intolerant to PAP therapy, consider referral to ENT for evaluation for hypoglossal nerve stimulator.   3.  Close follow-up is necessary to ensure success with CPAP or oral appliance therapy for maximum benefit.  4.  A follow-up oximetry study on CPAP is recommended to assess the adequacy of therapy and determine the need for supplemental oxygen or the potential need for Bi-level therapy.  An arterial blood gas to determine the adequacy of baseline ventilation and oxygenation should also be considered.  5.  Healthy sleep recommendations include:  adequate nightly sleep (normal 7-9 hrs/night), avoidance of caffeine after noon and alcohol near bedtime, and maintaining a sleep environment that is cool, dark and quiet.  6.  Weight loss for overweight patients is recommended.  Even modest amounts of weight loss can significantly improve the severity of sleep apnea.  7.  Snoring recommendations include:  weight loss where appropriate, side sleeping, and avoidance of alcohol before bed.  8.  Operation of motor vehicle should be avoided when sleepy.  Signature:   Armanda Magic, MD; [facility]; Diplomat, American Board  of Sleep Medicine Electronically Signed: 02/14/2023 4:59:52 PM

## 2023-03-13 ENCOUNTER — Telehealth: Payer: Self-pay

## 2023-03-13 ENCOUNTER — Encounter: Payer: Self-pay | Admitting: Podiatry

## 2023-03-13 ENCOUNTER — Ambulatory Visit: Payer: Medicare PPO | Admitting: Podiatry

## 2023-03-13 DIAGNOSIS — M79674 Pain in right toe(s): Secondary | ICD-10-CM | POA: Diagnosis not present

## 2023-03-13 DIAGNOSIS — B351 Tinea unguium: Secondary | ICD-10-CM

## 2023-03-13 DIAGNOSIS — Z961 Presence of intraocular lens: Secondary | ICD-10-CM | POA: Diagnosis not present

## 2023-03-13 DIAGNOSIS — M79675 Pain in left toe(s): Secondary | ICD-10-CM | POA: Diagnosis not present

## 2023-03-13 DIAGNOSIS — L84 Corns and callosities: Secondary | ICD-10-CM

## 2023-03-13 DIAGNOSIS — H18413 Arcus senilis, bilateral: Secondary | ICD-10-CM | POA: Diagnosis not present

## 2023-03-13 DIAGNOSIS — H35371 Puckering of macula, right eye: Secondary | ICD-10-CM | POA: Diagnosis not present

## 2023-03-13 DIAGNOSIS — H26492 Other secondary cataract, left eye: Secondary | ICD-10-CM | POA: Diagnosis not present

## 2023-03-13 NOTE — Progress Notes (Signed)
Subjective:   Patient ID: Scott Johnson, male   DOB: 77 y.o.   MRN: [id number]   HPI Patient presents stating that he has severe lesions on both feet and nails that are thick and incurvated that he cannot take care of   ROS      Objective:  Physical Exam  Neurovascular status intact with severe keratotic lesion of the first third metatarsal good jerks are lateral that are thickened and painful with nail disease 1-5 both feet with history of ulceration under the digits and pain within the nailbeds     Assessment:  Chronic lesions bilateral preulcerative with history of ulceration with mycotic nail infection 1-5 both feet     Plan:  Debrided nailbeds 1-5 both feet debrided lesions bilateral angiogenic bleeding reappoint routine care

## 2023-03-13 NOTE — Telephone Encounter (Signed)
Called [facility] class referral, he would like to attend on June 18, every T/Th 10-11:15; will contact first week of June to set up assessment visit

## 2023-03-18 ENCOUNTER — Telehealth: Payer: Self-pay

## 2023-03-18 NOTE — Telephone Encounter (Signed)
Patient notified of sleep study results and recommendations. Patient refused CPAP Therapy. Patient refused further conversation about alternative sleep therapy options with provider.

## 2023-03-26 ENCOUNTER — Ambulatory Visit: Payer: Medicare PPO | Admitting: Orthopedic Surgery

## 2023-03-26 ENCOUNTER — Other Ambulatory Visit (INDEPENDENT_AMBULATORY_CARE_PROVIDER_SITE_OTHER): Payer: Medicare PPO

## 2023-03-26 ENCOUNTER — Encounter: Payer: Self-pay | Admitting: Orthopedic Surgery

## 2023-03-26 ENCOUNTER — Telehealth: Payer: Self-pay

## 2023-03-26 DIAGNOSIS — G43109 Migraine with aura, not intractable, without status migrainosus: Secondary | ICD-10-CM | POA: Diagnosis not present

## 2023-03-26 DIAGNOSIS — Z6838 Body mass index (BMI) 38.0-38.9, adult: Secondary | ICD-10-CM | POA: Diagnosis not present

## 2023-03-26 DIAGNOSIS — M25562 Pain in left knee: Secondary | ICD-10-CM

## 2023-03-26 DIAGNOSIS — R6884 Jaw pain: Secondary | ICD-10-CM | POA: Diagnosis not present

## 2023-03-26 NOTE — Progress Notes (Unsigned)
Office Visit Note   Patient: Scott Johnson           Date of Birth: Jun 06, 1946           MRN: [id number] Visit Date: 03/26/2023 Requested by: Daisy Floro, MD [address] [place],  [place] [address] PCP: Daisy Floro, MD  Subjective: Chief Complaint  Patient presents with   Left Knee - Pain    HPI: Scott Johnson is a 77 y.o. male who presents to the office reporting left knee pain.  Patient was last seen 2021.  Would like to know the status of how much worse his knee has gotten.  Does describe some increased pain with increased activity.  At times he ambulates with a limp.  Pain does not wake him from sleep at night.  Denies any locking popping.  He states he did fall in his yard few years ago when his knee gave way.  He retired at the end of 2022.  Gel injection last performed in 2021.  Has a history of right total knee replacement from which she is doing well..                ROS: All systems reviewed are negative as they relate to the chief complaint within the history of present illness.  Patient denies fevers or chills.  Assessment & Plan: Visit Diagnoses:  1. Left knee pain, unspecified chronicity     Plan: Impression is relatively stable radiographically left knee arthritis.  Has some medial compartment arthritis.  No effusion today which is a good sign.  I think [name] is satisfied with the lack of radiographic progression of his left knee.  Could consider repeat injections should his symptoms worsen.  He does want knee replacement can do in terms of pain relief as well as the rehab involved based on his experience with the right side.  He will follow-up with [place] as needed.  Follow-Up Instructions: No follow-ups on file.   Orders:  Orders Placed This Encounter  Procedures   XR KNEE 3 VIEW LEFT   No orders of the defined types were placed in this encounter.     Procedures: No procedures performed   Clinical Data: No additional  findings.  Objective: Vital Signs: There were no vitals taken for this visit.  Physical Exam:  Constitutional: Patient appears well-developed HEENT:  Head: Normocephalic Eyes:EOM are normal Neck: Normal range of motion Cardiovascular: Normal rate Pulmonary/chest: Effort normal Neurologic: Patient is alert Skin: Skin is warm Psychiatric: Patient has normal mood and affect  Ortho Exam: Ortho exam demonstrates range of motion of that left knee of 5-1 15.  No effusion.  Collateral crucial ligaments are stable.  Slight varus alignment is present.  Foot is perfused and sensate.  No groin pain on the left with internal or external rotation of the leg.  Specialty Comments:  No specialty comments available.  Imaging: XR KNEE 3 VIEW LEFT  Result Date: 03/26/2023 AP lateral merchant radiographs left knee reviewed.  End-stage tricompartmental arthritis is present worse in the medial compartment with bone-on-bone changes.  Patellofemoral and lateral compartment also demonstrate moderate arthritic changes.  No acute fracture.  Mild varus alignment.  Unchanged compared to radiographs from 3 years ago    PMFS History: Patient Active Problem List   Diagnosis Date Noted   Snoring 02/11/2023   S/P CABG x 4 06/13/2016   Angina, class III (HCC) 05/15/2016   Essential hypertension 05/15/2016   Hyperlipidemia 05/15/2016  Obesity (BMI 30-39.9) 05/15/2016   Degenerative arthritis of right knee 09/26/2015   Past Medical History:  Diagnosis Date   Arthritis    Benign prostate hyperplasia    takes Proscar daily   Colon polyp    benign   Coronary artery disease    Essential hypertension 05/15/2016   History of bronchitis    many yrs ago   Hyperlipidemia    takes Zocor daily    Hypertension    takes Imdur,Metoprolol,and Losartan-HCTZ daily   Joint pain    Snoring 02/11/2023    Family History  Problem Relation Age of Onset   Heart failure Mother    Hypertension Mother    Heart failure  Father    Cancer Sister    COPD Sister    Heart disease Maternal Grandmother    Heart failure Maternal Grandfather    Heart attack Paternal Grandmother     Past Surgical History:  Procedure Laterality Date   CARDIAC CATHETERIZATION N/A 05/21/2016   Procedure: Left Heart Cath and Coronary Angiography;  Surgeon: Marykay Lex, MD;  Location: [facility] INVASIVE CV LAB;  Service: Cardiovascular;  Laterality: N/A;   COLONOSCOPY     CORONARY ARTERY BYPASS GRAFT N/A 06/13/2016   Procedure: CORONARY ARTERY BYPASS GRAFTING (CABG)x 4 with endoscopic harvesting of right greater saphenous vein;  Surgeon: Alleen Borne, MD;  Location: [facility] OR;  Service: Open Heart Surgery;  Laterality: N/A;   EYE SURGERY Bilateral 2015   Cataract removal   TEE WITHOUT CARDIOVERSION N/A 06/13/2016   Procedure: TRANSESOPHAGEAL ECHOCARDIOGRAM (TEE);  Surgeon: Alleen Borne, MD;  Location: [facility] OR;  Service: Open Heart Surgery;  Laterality: N/A;   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/26/2015   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/26/2015   Procedure: TOTAL KNEE ARTHROPLASTY, CEMENTED;  Surgeon: Cammy Copa, MD;  Location: [facility] OR;  Service: Orthopedics;  Laterality: Right;   Social History   Occupational History   Not on file  Tobacco Use   Smoking status: Never   Smokeless tobacco: Never  Vaping Use   Vaping Use: Never used  Substance and Sexual Activity   Alcohol use: No   Drug use: No   Sexual activity: Not on file

## 2023-03-26 NOTE — Telephone Encounter (Signed)
Called to confirm  partcipation in next PREP class at [facility] on June 18, every T/Th 10-11:15; assessment visit scheduled for June 11 at 10 am

## 2023-04-01 NOTE — Progress Notes (Signed)
[facility] PREP Evaluation  Patient Details  Name: Scott Johnson MRN: [id number] Date of Birth: Feb 11, 1946 Age: 77 y.o. PCP: Daisy Floro, MD  Vitals:   04/01/23 1022  BP: 128/78  Pulse: 98  SpO2: 97%  Weight: 279 lb 9.6 oz (126.8 kg)     [facility] Eval - 04/01/23 1000       [facility] "PREP" Location   [facility] "PREP" Location [name]      Referral    Referring Provider Duke Salvia    Reason for referral Hypertension;High Cholesterol;Inactivity;Obesitity/Overweight    Program Start Date 04/08/23      Measurement   Waist Circumference 50.5 inches    Hip Circumference 56.5 inches    Body fat 39.3 percent      Information for Trainer   Goals --   Increase stamina; establish exercise/strength training routine; lost 10-15 pouns by end of program   Current Exercise none    Orthopedic Concerns --   R TKA '16; L Knee OA   Pertinent Medical History --   HTN, s/p CABG x4 '18; high cholesterol     Timed Up and Go (TUGS)   Timed Up and Go Low risk <9 seconds      Mobility and Daily Activities   I find it easy to walk up or down two or more flights of stairs. 1    I have no trouble taking out the trash. 4    I do housework such as vacuuming and dusting on my own without difficulty. 1    I can easily lift a gallon of milk (8lbs). 4    I can easily walk a mile. 1    I have no trouble reaching into high cupboards or reaching down to pick up something from the floor. 4    I do not have trouble doing out-door work such as Loss adjuster, chartered, raking leaves, or gardening. 1      Mobility and Daily Activities   I feel younger than my age. 2    I feel independent. 4    I feel energetic. 2    I live an active life.  1    I feel strong. 1    I feel healthy. 2    I feel active as other people my age. 1      How fit and strong are you.   Fit and Strong Total Score 29            Past Medical History:  Diagnosis Date   Arthritis    Benign prostate hyperplasia    takes Proscar  daily   Colon polyp    benign   Coronary artery disease    Essential hypertension 05/15/2016   History of bronchitis    many yrs ago   Hyperlipidemia    takes Zocor daily    Hypertension    takes Imdur,Metoprolol,and Losartan-HCTZ daily   Joint pain    Snoring 02/11/2023   Past Surgical History:  Procedure Laterality Date   CARDIAC CATHETERIZATION N/A 05/21/2016   Procedure: Left Heart Cath and Coronary Angiography;  Surgeon: Marykay Lex, MD;  Location: [facility] INVASIVE CV LAB;  Service: Cardiovascular;  Laterality: N/A;   COLONOSCOPY     CORONARY ARTERY BYPASS GRAFT N/A 06/13/2016   Procedure: CORONARY ARTERY BYPASS GRAFTING (CABG)x 4 with endoscopic harvesting of right greater saphenous vein;  Surgeon: Alleen Borne, MD;  [facility]: [facility] OR;  Service: Open Heart Surgery;  Laterality: N/A;   EYE  SURGERY Bilateral 2015   Cataract removal   TEE WITHOUT CARDIOVERSION N/A 06/13/2016   Procedure: TRANSESOPHAGEAL ECHOCARDIOGRAM (TEE);  Surgeon: Alleen Borne, MD;  Location: [facility] OR;  Service: Open Heart Surgery;  Laterality: N/A;   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/26/2015   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/26/2015   Procedure: TOTAL KNEE ARTHROPLASTY, CEMENTED;  Surgeon: Cammy Copa, MD;  Location: [facility] OR;  Service: Orthopedics;  Laterality: Right;   Social History   Tobacco Use  Smoking Status Never  Smokeless Tobacco Never  To begin PREP class at [facility] on June 18, every T/Th 10-11:15  Sonia Baller 04/01/2023, 10:31 AM

## 2023-04-08 NOTE — Progress Notes (Signed)
[facility] PREP Weekly Session  Patient Details  Name: Treymon Piraino MRN: [id number] Date of Birth: 1945/12/16 Age: 77 y.o. PCP: Daisy Floro, MD  There were no vitals filed for this visit.   [facility] Weekly seesion - 04/08/23 1100       [facility] "PREP" Location   [facility] "PREP" Location [name]      Weekly Session   Topic Discussed Goal setting and welcome to the program   introductions, review of notebook tour of facility; offered short intro to cardio machine w/option exercise   Classes attended to date 1             Dniyah Grant B Mackinzie Vuncannon 04/08/2023, 11:36 AM

## 2023-04-15 ENCOUNTER — Ambulatory Visit: Payer: Medicare PPO | Admitting: Podiatry

## 2023-04-15 ENCOUNTER — Ambulatory Visit (INDEPENDENT_AMBULATORY_CARE_PROVIDER_SITE_OTHER): Payer: Medicare PPO

## 2023-04-15 ENCOUNTER — Encounter: Payer: Self-pay | Admitting: Podiatry

## 2023-04-15 DIAGNOSIS — L03031 Cellulitis of right toe: Secondary | ICD-10-CM

## 2023-04-15 DIAGNOSIS — R7301 Impaired fasting glucose: Secondary | ICD-10-CM | POA: Insufficient documentation

## 2023-04-15 DIAGNOSIS — N1831 Chronic kidney disease, stage 3a: Secondary | ICD-10-CM | POA: Insufficient documentation

## 2023-04-15 DIAGNOSIS — L97511 Non-pressure chronic ulcer of other part of right foot limited to breakdown of skin: Secondary | ICD-10-CM

## 2023-04-15 DIAGNOSIS — R600 Localized edema: Secondary | ICD-10-CM

## 2023-04-15 DIAGNOSIS — L02611 Cutaneous abscess of right foot: Secondary | ICD-10-CM

## 2023-04-15 MED ORDER — DOXYCYCLINE HYCLATE 100 MG PO CAPS: 100.0000 mg | ORAL_CAPSULE | Freq: Two times a day (BID) | ORAL | 0 refills | Status: AC

## 2023-04-15 NOTE — Progress Notes (Signed)
Chief Complaint  Patient presents with   Toe Pain    3rd toe right - pain started Friday, toe swollen and red, blister opened yesterday, no treatment    HPI: 77 y.o. male presenting today with a blister on the right third toe that he feels might be infected.  States that this has happened in the past with one of his corns had gotten into.  He does note that he sees Dr. Charlsie Merles regularly to have his corn shaved.  He was last seen in May for this denies any increase ambulation on his feet recently  Past Medical History:  Diagnosis Date   Arthritis    Benign prostate hyperplasia    takes Proscar daily   Colon polyp    benign   Coronary artery disease    Essential hypertension 05/15/2016   History of bronchitis    many yrs ago   Hyperlipidemia    takes Zocor daily    Hypertension    takes Imdur,Metoprolol,and Losartan-HCTZ daily   Joint pain    Snoring 02/11/2023    Past Surgical History:  Procedure Laterality Date   CARDIAC CATHETERIZATION N/A 05/21/2016   Procedure: Left Heart Cath and Coronary Angiography;  Surgeon: Marykay Lex, MD;  Location: [facility] INVASIVE CV LAB;  Service: Cardiovascular;  Laterality: N/A;   COLONOSCOPY     CORONARY ARTERY BYPASS GRAFT N/A 06/13/2016   Procedure: CORONARY ARTERY BYPASS GRAFTING (CABG)x 4 with endoscopic harvesting of right greater saphenous vein;  Surgeon: Alleen Borne, MD;  Location: [facility] OR;  Service: Open Heart Surgery;  Laterality: N/A;   EYE SURGERY Bilateral 2015   Cataract removal   TEE WITHOUT CARDIOVERSION N/A 06/13/2016   Procedure: TRANSESOPHAGEAL ECHOCARDIOGRAM (TEE);  Surgeon: Alleen Borne, MD;  Location: [facility] OR;  Service: Open Heart Surgery;  Laterality: N/A;   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/26/2015   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/26/2015   Procedure: TOTAL KNEE ARTHROPLASTY, CEMENTED;  Surgeon: Cammy Copa, MD;  Location: [facility] OR;  Service: Orthopedics;  Laterality: Right;    Allergies  Allergen Reactions    Penicillins Rash and Other (See Comments)    ORAL AMPICILLIN IMMEDIATE RX  Has patient had a PCN reaction causing immediate rash, facial/tongue/throat swelling, SOB or lightheadedness with hypotension: Yes Has patient had a PCN reaction causing severe rash involving mucus membranes or skin necrosis: No Has patient had a PCN reaction that required hospitalization No Has patient had a PCN reaction occurring within the last 10 years: No If all of the above answers are "NO", then may proceed with Cephalosporin use.      PHYSICAL EXAM:  General: The patient is alert and oriented x3 in no acute distress.  Dermatology: Skin is warm, dry and supple bilateral lower extremities. Interspaces are clear of maceration and debris.  There is a superficial blister to the medial aspect of the distal right third toe corn    Wound 1 description:  Location: Distal right third toe    Depth: To dermis    Wound Border: Hyperkeratotic and slight maceration    Odor?:  Musty    Surrounding Tissue: Associated superficial blister on the right third toe.  There is erythema and edema to the right third toe    Infected?:  Yes    Necrosis?:  No    Pain?:  Yes    Tunneling: None    Dimensions (cm): 0.2 cm x 0.2 cm x 0.1 cm  Vascular:  Pedal pulses are diminished but trace palpable  Neurological: Light touch sensation grossly intact bilateral feet.   Musculoskeletal Exam: There is a hammertoe to the right third toe there is pain on palpation to the distal right third toe at the site of the ulcerated corn  RADIOGRAPHIC EXAM (right foot 3 weightbearing views 04/15/2023:  Normal osseous mineralization. Joint spaces preserved.  No fractures or osseous irregularities noted.  No evidence of erosive changes to the third toe or any gas seen within the soft tissue   ASSESSMENT / PLAN OF CARE: 1. Skin ulcer of third toe of right foot, limited to breakdown of skin (HCC)     Meds ordered this encounter  Medications    doxycycline (VIBRAMYCIN) 100 MG capsule    Sig: Take 1 capsule (100 mg total) by mouth 2 (two) times daily for 10 days.    Dispense:  20 capsule    Refill:  0   The ulcerated corn and associated blister on the distal right third toe was sharply debrided of hyperkeratotic and devitalized soft tissue with sterile #312 blade to the level of dermis.  Hemostasis obtained.  Antibiotic ointment and DSD applied.  Reviewed off-loading with patient.  Reviewed daily dressing changes with patient.  He will perform daily Epsom salt soaks, then after drying the foot well he will apply a small amount of iodine solution to the distal aspect of the toe followed by a gauze bandage and change daily.  Prescription doxycycline 100 mg, 1 tablet p.o. twice daily for the next 10 days was sent to his pharmacy  Discussed risks / concerns regarding ulcer with patient and possible sequelae if left untreated.  Stressed importance of infection prevention at home. Short-term goals are:  resolve infection, off-load ulcer, heal ulcer Long-term goals are:  prevent recurrence, prevent amputation.   Return in about 1 week (around 04/22/2023) for recheck infected corn R-3 toe.   Clerance Lav, DPM, FACFAS [facility]     [address] [place], [place] [address]                Office [phone]  Fax [phone]

## 2023-04-15 NOTE — Progress Notes (Signed)
[facility] PREP Weekly Session  Patient Details  Name: Scott Johnson MRN: [id number] Date of Birth: 1946/05/26 Age: 77 y.o. PCP: Daisy Floro, MD  Vitals:   04/15/23 1139  Weight: 279 lb 9.6 oz (126.8 kg)     [facility] Weekly seesion - 04/15/23 1100       [facility] "PREP" Location   [facility] "PREP" Location [name]      Weekly Session   Topic Discussed Importance of resistance training;Other ways to be active   Cardio goal: work up to 150 minutes/wk; resistance training: work up to 2-3 times/wk for 20-40 minutes   Classes attended to date 3             Scott Johnson 04/15/2023, 11:39 AM

## 2023-04-22 ENCOUNTER — Ambulatory Visit: Payer: Medicare PPO | Admitting: Podiatry

## 2023-04-22 DIAGNOSIS — L97511 Non-pressure chronic ulcer of other part of right foot limited to breakdown of skin: Secondary | ICD-10-CM

## 2023-04-22 NOTE — Progress Notes (Signed)
Chief Complaint  Patient presents with   Callouses    Pt is here for 1 week follow up on his corn from his right foot 3rd toe    HPI: 77 y.o. male presenting today for follow-up of distal right third toe infected ulcerated corn.  Patient has 3 more days of his doxycycline remaining.  He has been tolerating this pretty well.  He has been performing Epsom salt soaks and applying iodine to the toe daily.  He states that he is not able to reach down toe with gauze so he has been covering it with a Band-Aid.  Notes minimal pain to area  Past Medical History:  Diagnosis Date   Arthritis    Benign prostate hyperplasia    takes Proscar daily   Colon polyp    benign   Coronary artery disease    Essential hypertension 05/15/2016   History of bronchitis    many yrs ago   Hyperlipidemia    takes Zocor daily    Hypertension    takes Imdur,Metoprolol,and Losartan-HCTZ daily   Joint pain    Snoring 02/11/2023    Past Surgical History:  Procedure Laterality Date   CARDIAC CATHETERIZATION N/A 05/21/2016   Procedure: Left Heart Cath and Coronary Angiography;  Surgeon: Marykay Lex, MD;  Location: [facility] INVASIVE CV LAB;  Service: Cardiovascular;  Laterality: N/A;   COLONOSCOPY     CORONARY ARTERY BYPASS GRAFT N/A 06/13/2016   Procedure: CORONARY ARTERY BYPASS GRAFTING (CABG)x 4 with endoscopic harvesting of right greater saphenous vein;  Surgeon: Alleen Borne, MD;  Location: [facility] OR;  Service: Open Heart Surgery;  Laterality: N/A;   EYE SURGERY Bilateral 2015   Cataract removal   TEE WITHOUT CARDIOVERSION N/A 06/13/2016   Procedure: TRANSESOPHAGEAL ECHOCARDIOGRAM (TEE);  Surgeon: Alleen Borne, MD;  Location: [facility] OR;  Service: Open Heart Surgery;  Laterality: N/A;   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/26/2015   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/26/2015   Procedure: TOTAL KNEE ARTHROPLASTY, CEMENTED;  Surgeon: Cammy Copa, MD;  Location: [facility] OR;  Service: Orthopedics;  Laterality: Right;     Allergies  Allergen Reactions   Penicillins Rash and Other (See Comments)    ORAL AMPICILLIN IMMEDIATE RX  Has patient had a PCN reaction causing immediate rash, facial/tongue/throat swelling, SOB or lightheadedness with hypotension: Yes Has patient had a PCN reaction causing severe rash involving mucus membranes or skin necrosis: No Has patient had a PCN reaction that required hospitalization No Has patient had a PCN reaction occurring within the last 10 years: No If all of the above answers are "NO", then may proceed with Cephalosporin use.     Smoker?: non-smoker    PHYSICAL EXAM: There were no vitals filed for this visit.  General: The patient is alert and oriented x3 in no acute distress.  Dermatology: Skin is warm, dry and supple bilateral lower extremities. Interspaces are clear of maceration and debris.      Wound 1 description:  Location: Distal right third toe    Depth: To dermis    Wound Border: Hyperkeratotic with some maceration along the medial aspect of the third toe.    Odor?:  None    Surrounding Tissue: Improving erythema and edema    Infected?:  This is resolving    Necrosis?:  No    Pain?:  Minimal    Tunneling: No    Dimensions (cm): 0.3 x 0.2 x 0.1  Vascular: Pedal pulses  are diminished  Neurological: Light touch sensation grossly intact bilateral feet.   Musculoskeletal Exam: There are digital contractures of the second and third toes at the PIP joint which is not fully reducible with hammertoe deformities.  ASSESSMENT / PLAN OF CARE: 1. Skin ulcer of third toe of right foot, limited to breakdown of skin (HCC)     The ulceration was sharply debrided of hyperkeratotic and devitalized soft tissue with sterile #312 blade to the level of dermis .  Hemostasis obtained.  Antibiotic ointment and DSD applied.  Reviewed off-loading with patient.  Dispensed a Silipos gel toe crest to wear in 1 week during waking hours.  For the remaining portion of this  week he will continue with his current daily soaks and dressing changes  Discussed risks / concerns regarding ulcer with patient and possible sequelae if left untreated.  Stressed importance of infection prevention at home. Short-term goals are:  resolve infection, off-load ulcer, heal ulcer Long-term goals are:  prevent recurrence, prevent amputation.   Return for as scheduled with Dr. Charlsie Merles for [facility].   Clerance Lav, DPM, FACFAS [facility]     [address] [place], [place] [address]                Office [phone]  Fax [phone]

## 2023-04-22 NOTE — Progress Notes (Signed)
[facility] PREP Weekly Session  Patient Details  Name: Scott Johnson MRN: [id number] Date of Birth: 06-Mar-1946 Age: 77 y.o. PCP: Daisy Floro, MD  Vitals:   04/22/23 1135  Weight: 275 lb 12.8 oz (125.1 kg)     [facility] Weekly seesion - 04/22/23 1100       [facility] "PREP" Location   [facility] "PREP" Location [name]      Weekly Session   Topic Discussed Healthy eating tips   Foods to increase, foods to reduce; introduced YUKA app, eat the rainbow of colors   Minutes exercised this week 40 minutes    Classes attended to date 5             Scott Johnson B Scott Johnson 04/22/2023, 11:36 AM

## 2023-04-29 NOTE — Progress Notes (Signed)
[facility] PREP Weekly Session  Patient Details  Name: Scott Johnson MRN: [id number] Date of Birth: Aug 04, 1946 Age: 77 y.o. PCP: Daisy Floro, MD  Vitals:   04/29/23 1143  Weight: 275 lb (124.7 kg)     [facility] Weekly seesion - 04/29/23 1100       [facility] "PREP" Location   [facility] "PREP" Location [name]      Weekly Session   Topic Discussed Health habits   Limit added sugars to 24 gms/women, 36 gms/men per day; Sugar demo   Classes attended to date 6             Scott Johnson 04/29/2023, 11:44 AM

## 2023-05-06 DIAGNOSIS — H903 Sensorineural hearing loss, bilateral: Secondary | ICD-10-CM | POA: Diagnosis not present

## 2023-05-06 DIAGNOSIS — H9313 Tinnitus, bilateral: Secondary | ICD-10-CM | POA: Diagnosis not present

## 2023-05-06 NOTE — Progress Notes (Signed)
[facility] PREP Weekly Session  Patient Details  Name: Morio Widen MRN: [id number] Date of Birth: 06-15-1946 Age: 77 y.o. PCP: Daisy Floro, MD  Vitals:   05/06/23 1108  Weight: 273 lb 9.6 oz (124.1 kg)     [facility] Weekly seesion - 05/06/23 1100       [facility] "PREP" Location   [facility] "PREP" Location [name]      Weekly Session   Topic Discussed Restaurant Eating   Limit Na intake to 1500-2300 mg/day; salt demo   Minutes exercised this week 65 minutes    Classes attended to date 8             Tremain Rucinski B Breyon Sigg 05/06/2023, 11:09 AM

## 2023-05-13 NOTE — Progress Notes (Signed)
[facility] PREP Weekly Session  Patient Details  Name: Scott Johnson MRN: [id number] Date of Birth: 06/24/1946 Age: 77 y.o. PCP: Daisy Floro, MD  Vitals:   05/13/23 1109  Weight: 274 lb 6.4 oz (124.5 kg)     [facility] Weekly seesion - 05/13/23 1100       [facility] "PREP" Location   [facility] "PREP" Location [name]      Weekly Session   Topic Discussed Stress management and problem solving   Finger tip mudra breathwork; importance of sleep: 7-9 hrs/night   Minutes exercised this week 73 minutes    Classes attended to date 10             Nandi Tonnesen B Elajah Kunsman 05/13/2023, 11:10 AM

## 2023-05-20 NOTE — Progress Notes (Signed)
[facility] PREP Weekly Session  Patient Details  Name: Scott Johnson MRN: [id number] Date of Birth: 1946-04-12 Age: 77 y.o. PCP: Daisy Floro, MD  Vitals:   05/20/23 1101  Weight: 274 lb (124.3 kg)     [facility] Weekly seesion - 05/20/23 1100       [facility] "PREP" Location   [facility] "PREP" Location [name]      Weekly Session   Topic Discussed Expectations and non-scale victories   Halfway thru program---review, revisit, re-state goals; staying positive   Minutes exercised this week 95 minutes    Classes attended to date 77             Jetaime Pinnix B Breon Rehm 05/20/2023, 11:02 AM

## 2023-05-26 NOTE — Progress Notes (Signed)
[facility] PREP Weekly Session  Patient Details  Name: Scott Johnson MRN: [id number] Date of Birth: 02/02/1946 Age: 77 y.o. PCP: Daisy Floro, MD  Vitals:   05/26/23 1509  Weight: 275 lb 3.2 oz (124.8 kg)     [facility] Weekly seesion - 05/26/23 1500       [facility] "PREP" Location   [facility] "PREP" Location [name]      Weekly Session   Topic Discussed Other   Portion control, visualize your portion size demo; review of Red, Sugar Craisin food label.   Minutes exercised this week 90 minutes    Classes attended to date 88             Shanyn Preisler B Everli Rother 05/26/2023, 3:10 PM

## 2023-06-04 DIAGNOSIS — H35371 Puckering of macula, right eye: Secondary | ICD-10-CM | POA: Diagnosis not present

## 2023-06-10 NOTE — Progress Notes (Signed)
[facility] PREP Weekly Session  Patient Details  Name: Scott Johnson MRN: [id number] Date of Birth: 1945/11/18 Age: 77 y.o. PCP: Daisy Floro, MD  Vitals:   06/10/23 1115  Weight: 270 lb 12.8 oz (122.8 kg)     [facility] Weekly seesion - 06/10/23 1100       [facility] "PREP" Location   [facility] "PREP" Location [name]      Weekly Session   Topic Discussed Finding support    Minutes exercised this week 80 minutes    Classes attended to date 63             Shauntavia Brackin B Quaniyah Bugh 06/10/2023, 11:16 AM

## 2023-06-16 ENCOUNTER — Encounter: Payer: Self-pay | Admitting: Podiatry

## 2023-06-16 ENCOUNTER — Ambulatory Visit: Payer: Medicare PPO | Admitting: Podiatry

## 2023-06-16 DIAGNOSIS — B351 Tinea unguium: Secondary | ICD-10-CM

## 2023-06-16 DIAGNOSIS — L97511 Non-pressure chronic ulcer of other part of right foot limited to breakdown of skin: Secondary | ICD-10-CM | POA: Diagnosis not present

## 2023-06-16 NOTE — Progress Notes (Signed)
Subjective:   Patient ID: Scott Johnson, male   DOB: 77 y.o.   MRN: [id number]   HPI Patient presents with an ulceration of the third digit right foot that he is concerned about other lesions that are moderately discomforting and elongated thickened nailbeds 1-5 both feet that he cannot take care of   ROS      Objective:  Physical Exam  Neurovascular status unchanged with third digit right distal showing slight localized drainage with no active redness or swelling proximal.  Several other lesions that are generalized and elongated thickened nailbeds 1-5 both feet     Assessment:  Low-grade skin ulceration digit 3 right localized with mycotic painful nailbeds 1-5 both feet     Plan:  H&P reviewed both conditions and for ulcer I have recommended utilizing padding to elevate the toe debridement soaks and monitoring.  Patient will be seen back for that is needed I debrided nailbeds 1-5 both feet Neutra genic bleeding reappoint routine care

## 2023-06-18 DIAGNOSIS — H31013 Macula scars of posterior pole (postinflammatory) (post-traumatic), bilateral: Secondary | ICD-10-CM | POA: Diagnosis not present

## 2023-06-18 DIAGNOSIS — H43392 Other vitreous opacities, left eye: Secondary | ICD-10-CM | POA: Diagnosis not present

## 2023-06-18 DIAGNOSIS — H3581 Retinal edema: Secondary | ICD-10-CM | POA: Diagnosis not present

## 2023-06-18 DIAGNOSIS — H43812 Vitreous degeneration, left eye: Secondary | ICD-10-CM | POA: Diagnosis not present

## 2023-06-18 DIAGNOSIS — H26492 Other secondary cataract, left eye: Secondary | ICD-10-CM | POA: Diagnosis not present

## 2023-06-18 DIAGNOSIS — H35373 Puckering of macula, bilateral: Secondary | ICD-10-CM | POA: Diagnosis not present

## 2023-06-24 NOTE — Progress Notes (Signed)
[facility] PREP Weekly Session  Patient Details  Name: Scott Johnson MRN: [id number] Date of Birth: 24-Jul-1946 Age: 77 y.o. PCP: Daisy Floro, MD  Vitals:   06/24/23 1109  Weight: 271 lb (122.9 kg)     [facility] Weekly seesion - 06/24/23 1100       [facility] "PREP" Location   [facility] "PREP" Location [name]      Weekly Session   Topic Discussed Calorie breakdown   [place]DA current recommendations for Carbs, proteins, fats; difference between simple/complex carbs   Minutes exercised this week 120 minutes    Classes attended to date 38             Tenisha Fleece B Cherisa Brucker 06/24/2023, 11:11 AM

## 2023-06-26 DIAGNOSIS — L814 Other melanin hyperpigmentation: Secondary | ICD-10-CM | POA: Diagnosis not present

## 2023-06-26 DIAGNOSIS — C44629 Squamous cell carcinoma of skin of left upper limb, including shoulder: Secondary | ICD-10-CM | POA: Diagnosis not present

## 2023-06-26 DIAGNOSIS — D492 Neoplasm of unspecified behavior of bone, soft tissue, and skin: Secondary | ICD-10-CM | POA: Diagnosis not present

## 2023-06-26 DIAGNOSIS — D225 Melanocytic nevi of trunk: Secondary | ICD-10-CM | POA: Diagnosis not present

## 2023-06-26 DIAGNOSIS — L821 Other seborrheic keratosis: Secondary | ICD-10-CM | POA: Diagnosis not present

## 2023-07-01 NOTE — Progress Notes (Signed)
[facility] PREP Weekly Session  Patient Details  Name: Scott Johnson MRN: [id number] Date of Birth: 04-04-1946 Age: 77 y.o. PCP: Daisy Floro, MD  Vitals:   07/01/23 1104  Weight: 269 lb 12.8 oz (122.4 kg)     [facility] Weekly seesion - 07/01/23 1100       [facility] "PREP" Location   [facility] "PREP" Location [name]      Weekly Session   Topic Discussed Other   How fit and strong survey completed; fit testing completed; appts make for final assessment visit on Thursday; review of goals and activity plan for next 90 days and PREP survey, to bring both to final assessment visit; membership talk w/Nick   Minutes exercised this week 46 minutes    Classes attended to date 13             Julianah Marciel B Limuel Nieblas 07/01/2023, 11:05 AM

## 2023-07-03 NOTE — Progress Notes (Signed)
[facility] PREP Evaluation  Patient Details  Name: Scott Johnson MRN: [id number] Date of Birth: 16-Jan-1946 Age: 77 y.o. PCP: Daisy Floro, MD  Vitals:   07/03/23 1119  BP: (!) 90/50  Pulse: 93  SpO2: 96%  Weight: 266 lb (120.7 kg)     [facility] Eval - 07/03/23 1100       [facility] "PREP" Location   [facility] "PREP" Location [name]      Referral    Program Start Date 04/08/23    Program End Date 07/03/23      Measurement   Waist Circumference 50.5 inches    Waist Circumference End Program 48 inches    Hip Circumference 56.5 inches    Hip Circumference End Program 54 inches    Body fat 37.7 percent      Mobility and Daily Activities   I find it easy to walk up or down two or more flights of stairs. 1    I have no trouble taking out the trash. 4    I do housework such as vacuuming and dusting on my own without difficulty. 3    I can easily lift a gallon of milk (8lbs). 4    I can easily walk a mile. 1    I have no trouble reaching into high cupboards or reaching down to pick up something from the floor. 4    I do not have trouble doing out-door work such as Loss adjuster, chartered, raking leaves, or gardening. 2      Mobility and Daily Activities   I feel younger than my age. 2    I feel independent. 4    I feel energetic. 2    I live an active life.  1    I feel strong. 1    I feel healthy. 2    I feel active as other people my age. 1      How fit and strong are you.   Fit and Strong Total Score 32            Past Medical History:  Diagnosis Date   Arthritis    Benign prostate hyperplasia    takes Proscar daily   Colon polyp    benign   Coronary artery disease    Essential hypertension 05/15/2016   History of bronchitis    many yrs ago   Hyperlipidemia    takes Zocor daily    Hypertension    takes Imdur,Metoprolol,and Losartan-HCTZ daily   Joint pain    Snoring 02/11/2023   Past Surgical History:  Procedure Laterality Date   CARDIAC  CATHETERIZATION N/A 05/21/2016   Procedure: Left Heart Cath and Coronary Angiography;  Surgeon: Marykay Lex, MD;  Location: [facility] INVASIVE CV LAB;  Service: Cardiovascular;  Laterality: N/A;   COLONOSCOPY     CORONARY ARTERY BYPASS GRAFT N/A 06/13/2016   Procedure: CORONARY ARTERY BYPASS GRAFTING (CABG)x 4 with endoscopic harvesting of right greater saphenous vein;  Surgeon: Alleen Borne, MD;  Location: [facility] OR;  Service: Open Heart Surgery;  Laterality: N/A;   EYE SURGERY Bilateral 2015   Cataract removal   TEE WITHOUT CARDIOVERSION N/A 06/13/2016   Procedure: TRANSESOPHAGEAL ECHOCARDIOGRAM (TEE);  Surgeon: Alleen Borne, MD;  Location: [facility] OR;  Service: Open Heart Surgery;  Laterality: N/A;   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/26/2015   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/26/2015   Procedure: TOTAL KNEE ARTHROPLASTY, CEMENTED;  Surgeon: Cammy Copa, MD;  Location: [facility] OR;  Service: Orthopedics;  Laterality: Right;   Social History   Tobacco Use  Smoking Status Never  Smokeless Tobacco Never  Wt loss: 13.6 pounds Inches lost: 4 Education sessions completed: 12 Workout sessions completed: 11 How fit and strong survey: 04/01/23: 29 07/03/23: 32 Note: BP 90/50 today after exercising; denies dizziness; not taking BP at home; encouraged to take BP daily, education on symptoms of hypotension and let MD know if BP continues to run low esp in light of developing any symptoms.   Dajon Lazar B Rylyn Ranganathan 07/03/2023, 11:22 AM

## 2023-07-07 DIAGNOSIS — H3581 Retinal edema: Secondary | ICD-10-CM | POA: Diagnosis not present

## 2023-07-07 DIAGNOSIS — H35371 Puckering of macula, right eye: Secondary | ICD-10-CM | POA: Diagnosis not present

## 2023-07-07 DIAGNOSIS — H33331 Multiple defects of retina without detachment, right eye: Secondary | ICD-10-CM | POA: Diagnosis not present

## 2023-07-10 DIAGNOSIS — D0462 Carcinoma in situ of skin of left upper limb, including shoulder: Secondary | ICD-10-CM | POA: Diagnosis not present

## 2023-07-15 DIAGNOSIS — H35372 Puckering of macula, left eye: Secondary | ICD-10-CM | POA: Diagnosis not present

## 2023-07-15 DIAGNOSIS — Z9889 Other specified postprocedural states: Secondary | ICD-10-CM | POA: Diagnosis not present

## 2023-07-15 DIAGNOSIS — H31091 Other chorioretinal scars, right eye: Secondary | ICD-10-CM | POA: Diagnosis not present

## 2023-07-25 DIAGNOSIS — R109 Unspecified abdominal pain: Secondary | ICD-10-CM | POA: Diagnosis not present

## 2023-07-25 DIAGNOSIS — Z6836 Body mass index (BMI) 36.0-36.9, adult: Secondary | ICD-10-CM | POA: Diagnosis not present

## 2023-07-25 DIAGNOSIS — M461 Sacroiliitis, not elsewhere classified: Secondary | ICD-10-CM | POA: Diagnosis not present

## 2023-07-26 DIAGNOSIS — R109 Unspecified abdominal pain: Secondary | ICD-10-CM | POA: Diagnosis not present

## 2023-08-05 DIAGNOSIS — H43812 Vitreous degeneration, left eye: Secondary | ICD-10-CM | POA: Diagnosis not present

## 2023-08-05 DIAGNOSIS — Z9889 Other specified postprocedural states: Secondary | ICD-10-CM | POA: Diagnosis not present

## 2023-08-05 DIAGNOSIS — H31091 Other chorioretinal scars, right eye: Secondary | ICD-10-CM | POA: Diagnosis not present

## 2023-09-09 ENCOUNTER — Other Ambulatory Visit: Payer: Self-pay | Admitting: Cardiovascular Disease

## 2023-09-17 ENCOUNTER — Encounter: Payer: Self-pay | Admitting: Podiatry

## 2023-09-17 ENCOUNTER — Ambulatory Visit: Payer: Medicare PPO | Admitting: Podiatry

## 2023-09-17 VITALS — Ht 73.0 in | Wt 266.0 lb

## 2023-09-17 DIAGNOSIS — L84 Corns and callosities: Secondary | ICD-10-CM

## 2023-09-17 DIAGNOSIS — M79675 Pain in left toe(s): Secondary | ICD-10-CM

## 2023-09-17 DIAGNOSIS — B351 Tinea unguium: Secondary | ICD-10-CM | POA: Diagnosis not present

## 2023-09-17 DIAGNOSIS — M79674 Pain in right toe(s): Secondary | ICD-10-CM

## 2023-09-17 DIAGNOSIS — L97511 Non-pressure chronic ulcer of other part of right foot limited to breakdown of skin: Secondary | ICD-10-CM

## 2023-09-17 NOTE — Progress Notes (Signed)
Subjective:   Patient ID: Scott Johnson, male   DOB: 77 y.o.   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

## 2023-09-21 ENCOUNTER — Telehealth: Payer: Medicare PPO | Admitting: Family

## 2023-09-21 DIAGNOSIS — K5900 Constipation, unspecified: Secondary | ICD-10-CM | POA: Diagnosis not present

## 2023-09-21 NOTE — Progress Notes (Signed)
Virtual Visit Consent   Scott Johnson, you are scheduled for a virtual visit with a [facility] provider today. Just as with appointments in the office, your consent must be obtained to participate. Your consent will be active for this visit and any virtual visit you may have with one of our providers in the next 365 days. If you have a MyChart account, a copy of this consent can be sent to you electronically.  As this is a virtual visit, video technology does not allow for your provider to perform a traditional examination. This may limit your provider's ability to fully assess your condition. If your provider identifies any concerns that need to be evaluated in person or the need to arrange testing (such as labs, EKG, etc.), we will make arrangements to do so. Although advances in technology are sophisticated, we cannot ensure that it will always work on either your end or our end. If the connection with a video visit is poor, the visit may have to be switched to a telephone visit. With either a video or telephone visit, we are not always able to ensure that we have a secure connection.  By engaging in this virtual visit, you consent to the provision of healthcare and authorize for your insurance to be billed (if applicable) for the services provided during this visit. Depending on your insurance coverage, you may receive a charge related to this service.  I need to obtain your verbal consent now. Are you willing to proceed with your visit today? Jasiyah Dively has provided verbal consent on 09/21/2023 for a virtual visit (video or telephone). Jannifer Rodney, FNP  Date: 09/21/2023 4:56 PM  Virtual Visit via Video Note   I, Jannifer Rodney, connected with  Maximo Raineri  ([id number], 27-Jun-1946) on 09/21/23 at  5:00 PM EST by a video-enabled telemedicine application and verified that I am speaking with the correct person using two identifiers.  Location: Patient: Virtual Visit Location  Patient: Home Provider: Virtual Visit Location Provider: Home Office   I discussed the limitations of evaluation and management by telemedicine and the availability of in person appointments. The patient expressed understanding and agreed to proceed.    History of Present Illness: Scott Johnson is a 77 y.o. who identifies as a male who was assigned male at birth, and is being seen today for constipation. He reports his last BM was 09/17/23. He has reported RUQ pain he noticed today. He took one colace today.   HPI: Constipation This is a new problem. The current episode started 1 to 4 weeks ago. Associated symptoms include abdominal pain, flatus and nausea. Pertinent negatives include no back pain, diarrhea, difficulty urinating, fever or vomiting. He has tried stool softeners for the symptoms. The treatment provided no relief.    Problems:  Patient Active Problem List   Diagnosis Date Noted   Chronic kidney disease, stage 3a (HCC) 04/15/2023   Impaired fasting glucose 04/15/2023   Snoring 02/11/2023   S/P CABG x 4 06/13/2016   Angina, class III (HCC) 05/15/2016   Essential hypertension 05/15/2016   Hyperlipidemia 05/15/2016   Obesity (BMI 30-39.9) 05/15/2016   Degenerative arthritis of right knee 09/26/2015    Allergies:  Allergies  Allergen Reactions   Penicillins Rash and Other (See Comments)    ORAL AMPICILLIN IMMEDIATE RX  Has patient had a PCN reaction causing immediate rash, facial/tongue/throat swelling, SOB or lightheadedness with hypotension: Yes Has patient had a PCN reaction causing severe rash involving mucus membranes or skin  necrosis: No Has patient had a PCN reaction that required hospitalization No Has patient had a PCN reaction occurring within the last 10 years: No If all of the above answers are "NO", then may proceed with Cephalosporin use.    Medications:  Current Outpatient Medications:    aspirin EC 81 MG tablet, Take 81 mg by mouth daily., Disp: ,  Rfl:    atorvastatin (LIPITOR) 40 MG tablet, TAKE 1 TABLET(40 MG) BY MOUTH DAILY AT 6 PM, Disp: 90 tablet, Rfl: 2   Cholecalciferol (VITAMIN D3) 125 MCG (5000 UT) CAPS, See admin instructions., Disp: , Rfl:    finasteride (PROSCAR) 5 MG tablet, Take 5 mg by mouth daily at 12 noon. , Disp: , Rfl: 3   hydrochlorothiazide (HYDRODIURIL) 25 MG tablet, Take 25 mg by mouth daily., Disp: , Rfl:    losartan (COZAAR) 100 MG tablet, Take 100 mg by mouth daily., Disp: , Rfl:    tiZANidine (ZANAFLEX) 2 MG tablet, 1-2 tablet at bedtime as needed Orally Once a day for 30 days for tight muscles or jaw pain, Disp: , Rfl:   Observations/Objective: Patient is well-developed, well-nourished in no acute distress.  Resting comfortably  at home.  Head is normocephalic, atraumatic.  No labored breathing.  Speech is clear and coherent with logical content.  Patient is alert and oriented at baseline.  Slight RUQ pain  Assessment and Plan: 1. Constipation, unspecified constipation type  Get magnesium citrate Force fluids Go to ED with any fevers, increased abdominal pain, or unable to pass gas May need MOM, mirlax, or enema  Has follow up with PCP tomorrow Reassurance given   Follow Up Instructions: I discussed the assessment and treatment plan with the patient. The patient was provided an opportunity to ask questions and all were answered. The patient agreed with the plan and demonstrated an understanding of the instructions.  A copy of instructions were sent to the patient via MyChart unless otherwise noted below.     The patient was advised to call back or seek an in-person evaluation if the symptoms worsen or if the condition fails to improve as anticipated.    Jannifer Rodney, FNP

## 2023-09-22 DIAGNOSIS — K5900 Constipation, unspecified: Secondary | ICD-10-CM | POA: Diagnosis not present

## 2023-09-22 DIAGNOSIS — Z6836 Body mass index (BMI) 36.0-36.9, adult: Secondary | ICD-10-CM | POA: Diagnosis not present

## 2023-12-03 DIAGNOSIS — L218 Other seborrheic dermatitis: Secondary | ICD-10-CM | POA: Diagnosis not present

## 2023-12-04 DIAGNOSIS — R351 Nocturia: Secondary | ICD-10-CM | POA: Diagnosis not present

## 2023-12-04 DIAGNOSIS — N401 Enlarged prostate with lower urinary tract symptoms: Secondary | ICD-10-CM | POA: Diagnosis not present

## 2023-12-18 ENCOUNTER — Encounter: Payer: Self-pay | Admitting: Podiatry

## 2023-12-18 ENCOUNTER — Ambulatory Visit: Payer: Medicare PPO | Admitting: Podiatry

## 2023-12-18 VITALS — Ht 73.0 in | Wt 266.0 lb

## 2023-12-18 DIAGNOSIS — B351 Tinea unguium: Secondary | ICD-10-CM | POA: Diagnosis not present

## 2023-12-18 DIAGNOSIS — M79675 Pain in left toe(s): Secondary | ICD-10-CM

## 2023-12-18 DIAGNOSIS — L84 Corns and callosities: Secondary | ICD-10-CM

## 2023-12-18 DIAGNOSIS — M79674 Pain in right toe(s): Secondary | ICD-10-CM | POA: Diagnosis not present

## 2023-12-18 NOTE — Progress Notes (Signed)
 Subjective:   Patient ID: Scott Johnson, male   DOB: 78 y.o.   MRN: [id number]   HPI Patient presents with severe lesions on the hallux and second toes of both feet with history of ulceration and thick yellow brittle nailbeds 1-5 both feet that he cannot cut and become painful   ROS      Objective:  Physical Exam  Neurovascular status intact with thick yellow brittle nailbeds 1-5 both feet and lesions on the hallux second toes of both feet painful when pressed     Assessment:  Chronic mycotic nail infection 1-5 both feet with pain and lesions x 4 painful with history of ulceration high risk     Plan:  H&P reviewed debrided lesions on both feet no iatrogenic bleeding debrided nailbeds 1-5 both feet no iatrogenic bleeding reappoint routine care

## 2024-01-07 DIAGNOSIS — Z85828 Personal history of other malignant neoplasm of skin: Secondary | ICD-10-CM | POA: Diagnosis not present

## 2024-01-07 DIAGNOSIS — L821 Other seborrheic keratosis: Secondary | ICD-10-CM | POA: Diagnosis not present

## 2024-01-07 DIAGNOSIS — Z08 Encounter for follow-up examination after completed treatment for malignant neoplasm: Secondary | ICD-10-CM | POA: Diagnosis not present

## 2024-01-07 DIAGNOSIS — L570 Actinic keratosis: Secondary | ICD-10-CM | POA: Diagnosis not present

## 2024-01-07 DIAGNOSIS — D225 Melanocytic nevi of trunk: Secondary | ICD-10-CM | POA: Diagnosis not present

## 2024-01-07 DIAGNOSIS — L814 Other melanin hyperpigmentation: Secondary | ICD-10-CM | POA: Diagnosis not present

## 2024-01-08 DIAGNOSIS — H35411 Lattice degeneration of retina, right eye: Secondary | ICD-10-CM | POA: Diagnosis not present

## 2024-01-08 DIAGNOSIS — H43813 Vitreous degeneration, bilateral: Secondary | ICD-10-CM | POA: Diagnosis not present

## 2024-01-08 DIAGNOSIS — Z961 Presence of intraocular lens: Secondary | ICD-10-CM | POA: Diagnosis not present

## 2024-01-08 DIAGNOSIS — H35371 Puckering of macula, right eye: Secondary | ICD-10-CM | POA: Diagnosis not present

## 2024-01-28 DIAGNOSIS — E559 Vitamin D deficiency, unspecified: Secondary | ICD-10-CM | POA: Diagnosis not present

## 2024-01-28 DIAGNOSIS — I10 Essential (primary) hypertension: Secondary | ICD-10-CM | POA: Diagnosis not present

## 2024-01-28 DIAGNOSIS — Z125 Encounter for screening for malignant neoplasm of prostate: Secondary | ICD-10-CM | POA: Diagnosis not present

## 2024-01-28 DIAGNOSIS — R7301 Impaired fasting glucose: Secondary | ICD-10-CM | POA: Diagnosis not present

## 2024-01-28 DIAGNOSIS — E782 Mixed hyperlipidemia: Secondary | ICD-10-CM | POA: Diagnosis not present

## 2024-02-04 DIAGNOSIS — Z125 Encounter for screening for malignant neoplasm of prostate: Secondary | ICD-10-CM | POA: Diagnosis not present

## 2024-02-04 DIAGNOSIS — N400 Enlarged prostate without lower urinary tract symptoms: Secondary | ICD-10-CM | POA: Diagnosis not present

## 2024-02-04 DIAGNOSIS — E782 Mixed hyperlipidemia: Secondary | ICD-10-CM | POA: Diagnosis not present

## 2024-02-04 DIAGNOSIS — E559 Vitamin D deficiency, unspecified: Secondary | ICD-10-CM | POA: Diagnosis not present

## 2024-02-04 DIAGNOSIS — Z0000 Encounter for general adult medical examination without abnormal findings: Secondary | ICD-10-CM | POA: Diagnosis not present

## 2024-02-04 DIAGNOSIS — N1831 Chronic kidney disease, stage 3a: Secondary | ICD-10-CM | POA: Diagnosis not present

## 2024-02-04 DIAGNOSIS — I10 Essential (primary) hypertension: Secondary | ICD-10-CM | POA: Diagnosis not present

## 2024-02-04 DIAGNOSIS — R7301 Impaired fasting glucose: Secondary | ICD-10-CM | POA: Diagnosis not present

## 2024-02-13 ENCOUNTER — Encounter (HOSPITAL_BASED_OUTPATIENT_CLINIC_OR_DEPARTMENT_OTHER): Payer: Self-pay | Admitting: Family

## 2024-02-13 ENCOUNTER — Ambulatory Visit (HOSPITAL_BASED_OUTPATIENT_CLINIC_OR_DEPARTMENT_OTHER): Payer: Medicare PPO | Admitting: Family

## 2024-02-13 VITALS — BP 124/66 | HR 70 | Ht 71.0 in | Wt 267.0 lb

## 2024-02-13 DIAGNOSIS — I10 Essential (primary) hypertension: Secondary | ICD-10-CM | POA: Diagnosis not present

## 2024-02-13 DIAGNOSIS — E785 Hyperlipidemia, unspecified: Secondary | ICD-10-CM | POA: Diagnosis not present

## 2024-02-13 DIAGNOSIS — I25118 Atherosclerotic heart disease of native coronary artery with other forms of angina pectoris: Secondary | ICD-10-CM

## 2024-02-13 MED ORDER — ATORVASTATIN CALCIUM 40 MG PO TABS
40.0000 mg | ORAL_TABLET | Freq: Every day | ORAL | 3 refills | Status: AC
Start: 2024-02-13 — End: ?

## 2024-02-13 NOTE — Patient Instructions (Addendum)
 Medication Instructions:  Continue your current medications.   *If you need a refill on your cardiac medications before your next appointment, please call your pharmacy*  Testing/Procedures: Your EKG today looks good!  Follow-Up: At [facility], you and your health needs are our priority.  As part of our continuing mission to provide you with exceptional heart care, our providers are all part of one team.  This team includes your primary Cardiologist (physician) and Advanced Practice Providers or APPs (Physician Assistants and Nurse Practitioners) who all work together to provide you with the care you need, when you need it.  Your next appointment:   June or July 2026 with Maudine Sos, MD, Slater Duncan, NP, or Neomi Banks, NP    We recommend signing up for the patient portal called "MyChart".  Sign up information is provided on this After Visit Summary.  MyChart is used to connect with patients for Virtual Visits (Telemedicine).  Patients are able to view lab/test results, encounter notes, upcoming appointments, etc.  Non-urgent messages can be sent to your provider as well.   To learn more about what you can do with MyChart, go to [email or web address].   Other Instructions  Keep up the great work at [employer] and with your eating habits!

## 2024-02-13 NOTE — Progress Notes (Signed)
 Cardiology Office Note:  .   Date:  02/13/2024  ID:  Drako Maese, DOB 1945/11/15, MRN [id number] PCP: Jimmey Mould, MD  [facility] Providers Cardiologist:  Maudine Sos, MD    History of Present Illness: .   Scott Johnson is a 78 y.o. male with a history of CAD s/p CABG X48/24/17 (LIMA-LAD, SVG-RI, SVG-OM, SVG-LCx), obesity, hypertension, hyperlipidemia.  Seen in clinic 05/15/2016 and referred for LHC due to angina which revealed severe three-vessel CAD and underwent four-vessel CABG with Dr. Audrea Learned 06/13/2018.  Postoperative atrial fibrillation managed with IV amiodarone  and was discontinued 09/2016.  Last seen 02/11/2023 recommended for sleep study and referred to PrEP exercise program.  Sleep study with mild OSA, declined CPAP.  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 Since last seen completed the PREP program. Has continued [employer] twice  per week cardio and weight training. Exercise tolernce somewhat limited by prior knee surgery.  Reports no shortness of breath nor dyspnea on exertion. Reports no chest pain, pressure, or tightness. No edema, orthopnea, PND. He has [facility] for intermittent palpitations which are not bothersome. .   ROS: Please see the history of present illness.    All other systems reviewed and are negative.   Studies Reviewed: .        Cardiac Studies & Procedures   ______________________________________________________________________________________________ CARDIAC CATHETERIZATION  CARDIAC CATHETERIZATION 05/21/2016  Conclusion Images from the original result were not included.   Ost LM to LM lesion, 40 %stenosed.  Ost Cx lesion, 90 %stenosed.  Ost Ramus lesion, 90 %stenosed. Ost 1st Mrg to 1st Mrg lesion, 90 %stenosed.  Ost LAD to Prox LAD lesion, 40 %stenosed. Mid LAD lesion, 75 %stenosed.  Prox nondominant RCA lesion, 90 %stenosed.  Dist LAD lesion, 40 %stenosed.  The left ventricular systolic function is normal. LV end  diastolic pressure is normal.   Patient has severe calcified ostial dominant Circumflex disease involving a Ramus Intermedius, OM branch in addition to calcified mid LAD stenosis after major diagonal branch. His relatively cloacal left main with heavy calcification. The nondominant right coronary artery is also severely diseased.  Patient's best option is to be referred for coronary artery bypass grafting given his dominant circumflex has severe disease ostially. This is not amenable PCI as it is no landing zone for stent.  Plan: Turn to short stay holding area post catheterization care. TR band removal per protocol. We will consult CT surgery to arrange outpatient consultation to schedule CABG. Will change from nitroglycerin  patch to Imdur , and add metoprolol .    Randene Bustard, M.D., M.S. Interventional Cardiologist  Pager # [phone] Phone # [phone] [address] [place], [place] [address]  Findings Coronary Findings Diagnostic  Dominance: Left  Left Main Vessel is large. Near separate ostia The vessel is moderately calcified. The lesion is calcified.  Left Anterior Descending The lesion is moderately calcified. The lesion is type C and distal to major branch. The lesion is moderately calcified. The lesion is tubular. Diffuse  Lateral First Diagonal Branch Vessel is moderate in size.  First Septal Branch Vessel is small in size.  Second Diagonal Branch Vessel is small in size.  Second Septal Branch Vessel is small in size.  Third Septal Branch Vessel is small in size.  Ramus Intermedius Vessel is moderate in size. The lesion is located at the major branch. The lesion is moderately calcified.  Left Circumflex The lesion is type C, located at the major branch and focal. The lesion is severely calcified.  First Obtuse Marginal Branch Vessel is small in size. The lesion is located at the major branch. The lesion is calcified.  Second  Obtuse Marginal Branch Vessel is moderate in size.  Lateral Third Obtuse Marginal Branch Vessel is small in size.  Left Posterior Descending Artery Vessel is moderate in size.  First Left Posterolateral Branch Vessel is small in size.  Second Left Posterolateral Branch Vessel is small in size.  Right Coronary Artery Vessel is small. The lesion is tubular and eccentric.  Intervention  No interventions have been documented.       TEE  ECHO TEE 06/13/2016  Narrative [place]* *[facility]* [address] [place], [place] [address] [phone]  ------------------------------------------------------------------- Transesophageal Echocardiography  Patient:    Scott Johnson, Scott Johnson MR #:       [id number] Study Date: 06/13/2016 Gender:     M Age:        64 Height: Weight: BSA: Pt. Status: Room:       2S05C  ADMITTING    Linder Revere, MD ATTENDING    Linder Revere, MD ORDERING     Linder Revere, MD REFERRING    Linder Revere, MD PERFORMING   Jonne Netters, MD SONOGRAPHER  Jimmy Reel, RDCS  cc:  ------------------------------------------------------------------- LV EF: 55% -   60%  ------------------------------------------------------------------- Indications:      CAD of native vessels 414.01.  ------------------------------------------------------------------- Study Conclusions  - Left ventricle: The cavity size was normal. Wall thickness was normal. Systolic function was normal. The estimated ejection fraction was in the range of 55% to 60%. Wall motion was normal; there were no regional wall motion abnormalities.  Impressions:  - Post CABG: No significant changes from pre-bypass images.  ------------------------------------------------------------------- Study data:   Study status:  Routine.  Consent:  The risks, benefits, and alternatives to the procedure were explained to the patient and informed consent was obtained.   Procedure:  Initial setup. The patient was brought to the operating room. Surface ECG leads were monitored. Sedation. General anesthesia was administered by anesthesiology staff. Transesophageal echocardiography. An adult multiplane transesophageal probe was inserted by the anesthesiologistwithout difficulty. Image quality was adequate. Study completion:  The patient tolerated the procedure well. There were no complications.          Diagnostic transesophageal echocardiography.  2D and color Doppler.  Birthdate:  Patient birthdate: [birth year].  Age:  Patient is 78 yr old.  Sex:  Gender: male.  Patient status:  Inpatient.  Study date:  Study date: 06/13/2016. Study time: 08:03 AM.  Location:  Operating room.  -------------------------------------------------------------------  ------------------------------------------------------------------- Left ventricle:  The cavity size was normal. Wall thickness was normal. Systolic function was normal. The estimated ejection fraction was in the range of 55% to 60%. Wall motion was normal; there were no regional wall motion abnormalities.  ------------------------------------------------------------------- Aortic valve:  Normal-sized, mildly calcified annulus. Trileaflet; normal thickness of right and left leaflets. Slight thickening of the non-coronary leaflet. Cusp separation was normal. Mobility was not restricted.  Doppler:  Transvalvular velocity was within the normal range. There was no stenosis. There was no significant regurgitation.  ------------------------------------------------------------------- Aorta:  The descending aorta was only mildly calcified.  ------------------------------------------------------------------- Mitral valve:   Normal-sized, noncalcified annulus. Normal thickness, noncalcified leaflets . Leaflet separation was normal. Mobility was not restricted. No echocardiographic evidence for prolapse.  No evidence of  vegetation.  Doppler:   There was no evidence for stenosis.   There was trivial regurgitation.  ------------------------------------------------------------------- Left atrium:   No evidence of  thrombus in the atrial cavity or appendage.  ------------------------------------------------------------------- Atrial septum:  No defect or patent foramen ovale was identified by color doppler interrogation.  ------------------------------------------------------------------- Pulmonary veins:  Visualization of the pulmonary venous anatomy is incomplete, but a significant abnormality is unlikely.  ------------------------------------------------------------------- Right ventricle:  The cavity size was normal. Wall thickness was normal. Systolic function was normal.  ------------------------------------------------------------------- Pulmonic valve:    Structurally normal valve.   Cusp separation was normal.  No evidence of vegetation.  Doppler:  There was trivial regurgitation around the PA catheter.  ------------------------------------------------------------------- Tricuspid valve:   Structurally normal valve.   Leaflet separation was normal.  No evidence of vegetation.  Doppler:  There was trivial regurgitation.  ------------------------------------------------------------------- Pulmonary artery:   The main pulmonary artery was normal-sized.  ------------------------------------------------------------------- Right atrium:  The atrium was normal in size.  No evidence of thrombus in the atrial cavity or appendage.  ------------------------------------------------------------------- Post procedure conclusions Left ventricle:  Limited Post-CPB study: Good LVEF, no wall motion abnormalities. Aortic valve:  - No change post bypass in aortic valve function.  Mitral valve:  - No change post bypass in mitral valve function.  Ascending Aorta:  - The descending aorta was only  mildly calcified.  ------------------------------------------------------------------- Prepared and Electronically Authenticated by  Jonne Netters, MD 2017-08-24T21:04:57        ______________________________________________________________________________________________      Risk Assessment/Calculations:         STOP-Bang Score:         Physical Exam:   VS:  BP 124/66   Pulse 70   Ht 5\' 11"  (1.803 m)   Wt 267 lb (121.1 kg)   SpO2 96%   BMI 37.24 kg/m    Wt Readings from Last 3 Encounters:  02/13/24 267 lb (121.1 kg)  12/18/23 266 lb (120.7 kg)  09/17/23 266 lb (120.7 kg)    GEN: Well nourished, well developed in no acute distress NECK: No JVD; No carotid bruits CARDIAC: RRR, no murmurs, rubs, gallops RESPIRATORY:  Clear to auscultation without rales, wheezing or rhonchi  ABDOMEN: Soft, non-tender, non-distended EXTREMITIES:  No edema; No deformity   ASSESSMENT AND PLAN: .    CAD s/p CABG x4 / HLD, LDL goal < 70 - Stable with no anginal symptoms. No indication for ischemic evaluation.  01/28/24 LDL 70. Continue Atorvastatin  40mg  daily, aspirin  81mg  daily. Recommend aiming for 150 minutes of moderate intensity activity per week and following a heart healthy diet.    HTN - BP well controlled. Continue current antihypertensive regimen Losartan  100mg  daily, hydrochlorothiazide  25mg  daily.  Discussed possible reduced dose of hydrochlorothiazide  due to nocturia and successful weight loss, he will contact us  if interested.  Obesity -  Congratulated on ~20 pound weight loss. Weight loss via diet and exercise encouraged. Discussed the impact being overweight would have on cardiovascular risk.   OSA - mild by sleep study 01/2023. Has politely declined sleep study. Encouraged to continue weight loss and avoid supine sleep if possible.        Dispo: follow up in 1 year with Dr. Theodis Fiscal or Clearnce Curia, NP or Slater Duncan, NP  Signed, Clearnce Curia, NP

## 2024-03-17 ENCOUNTER — Encounter: Payer: Self-pay | Admitting: Podiatry

## 2024-03-17 ENCOUNTER — Ambulatory Visit: Payer: Medicare PPO | Admitting: Podiatry

## 2024-03-17 VITALS — Ht 71.0 in | Wt 267.0 lb

## 2024-03-17 DIAGNOSIS — M2042 Other hammer toe(s) (acquired), left foot: Secondary | ICD-10-CM | POA: Diagnosis not present

## 2024-03-17 DIAGNOSIS — M79675 Pain in left toe(s): Secondary | ICD-10-CM

## 2024-03-17 DIAGNOSIS — M79674 Pain in right toe(s): Secondary | ICD-10-CM

## 2024-03-17 DIAGNOSIS — L97521 Non-pressure chronic ulcer of other part of left foot limited to breakdown of skin: Secondary | ICD-10-CM

## 2024-03-17 DIAGNOSIS — B351 Tinea unguium: Secondary | ICD-10-CM

## 2024-03-17 DIAGNOSIS — M2041 Other hammer toe(s) (acquired), right foot: Secondary | ICD-10-CM

## 2024-03-17 DIAGNOSIS — L97511 Non-pressure chronic ulcer of other part of right foot limited to breakdown of skin: Secondary | ICD-10-CM

## 2024-03-17 NOTE — Progress Notes (Signed)
 Subjective:   Patient ID: Scott Johnson, male   DOB: 78 y.o.   MRN: [id number]   HPI Patient presents severe keratotic lesion with slight breakdown of tissue third digit left foot with lesions that are present hallux also bilateral and severely thickened dystrophic nailbeds 1-5 both feet.  Mild redness in the toe no proximal edema erythema or drainage noted.  Also has noted elevation of the second digit left with significant nature   ROS      Objective:  Physical Exam  Neurovascular status intact with patient found to have distal keratotic lesion digit 3 left slight breakdown of tissue localized just to skin no subcutaneous or bone exposure mild redness of the toe with lesions on the hallux bilateral and thick yellow brittle nailbeds 1-5 both feet painful.  Patient has rigid contracture digit to left with lesion that is 4     Assessment:  Low-grade localized ulceration third digit left with mycotic nail infection with pain 1-5 both feet with hammertoe deformity second digit left foot that is gotten worse     Plan:  H&P done sterile sharp debridement of lesion third left and can soak as needed but it is localized and applied dressing.  Debrided nailbeds 1-5 both feet no iatrogenic bleeding reappoint routine care.  Discussed hammertoe deformity second digit left foot and discussed possible digital fusion

## 2024-04-09 DIAGNOSIS — F411 Generalized anxiety disorder: Secondary | ICD-10-CM | POA: Diagnosis not present

## 2024-04-16 DIAGNOSIS — F411 Generalized anxiety disorder: Secondary | ICD-10-CM | POA: Diagnosis not present

## 2024-04-23 DIAGNOSIS — F411 Generalized anxiety disorder: Secondary | ICD-10-CM | POA: Diagnosis not present

## 2024-04-30 DIAGNOSIS — F411 Generalized anxiety disorder: Secondary | ICD-10-CM | POA: Diagnosis not present

## 2024-05-05 ENCOUNTER — Ambulatory Visit (INDEPENDENT_AMBULATORY_CARE_PROVIDER_SITE_OTHER): Payer: Medicare PPO | Admitting: Otolaryngology

## 2024-05-05 ENCOUNTER — Encounter (INDEPENDENT_AMBULATORY_CARE_PROVIDER_SITE_OTHER): Payer: Self-pay | Admitting: Otolaryngology

## 2024-05-05 VITALS — BP 106/73 | HR 80 | Ht 72.0 in | Wt 252.0 lb

## 2024-05-05 DIAGNOSIS — H9313 Tinnitus, bilateral: Secondary | ICD-10-CM | POA: Insufficient documentation

## 2024-05-05 DIAGNOSIS — H6123 Impacted cerumen, bilateral: Secondary | ICD-10-CM | POA: Insufficient documentation

## 2024-05-05 DIAGNOSIS — H903 Sensorineural hearing loss, bilateral: Secondary | ICD-10-CM | POA: Insufficient documentation

## 2024-05-05 NOTE — Progress Notes (Signed)
 Patient ID: Scott Johnson, male   DOB: 1946/01/07, 78 y.o.   MRN: [id number]  Follow-up: Bilateral hearing loss, tinnitus  HPI: The patient is a 78 year old male who returns today for his follow-up evaluation.  The patient has a history of bilateral sensorineural hearing loss and bilateral high-pitched tinnitus.  The hearing amplification options and the strategies to cope with tinnitus were discussed.  The patient returns today reporting persistent hearing loss and tinnitus.  He denies any otalgia, otorrhea, or vertigo.  Exam: General: Communicates without difficulty, well nourished, no acute distress. Head: Normocephalic, no evidence injury, no tenderness, facial buttresses intact without stepoff. Face/sinus: No tenderness to palpation and percussion. Facial movement is normal and symmetric. Eyes: PERRL, EOMI. No scleral icterus, conjunctivae clear. Neuro: CN II exam reveals vision grossly intact.  No nystagmus at any point of gaze. Ears: Auricles well formed without lesions.  Bilateral cerumen impaction.  Nose: External evaluation reveals normal support and skin without lesions.  Dorsum is intact.  Anterior rhinoscopy reveals normal mucosa over anterior aspect of inferior turbinates and intact septum.  No purulence noted. Oral:  Oral cavity and oropharynx are intact, symmetric, without erythema or edema.  Mucosa is moist without lesions. Neck: Full range of motion without pain.  There is no significant lymphadenopathy.  No masses palpable.  Thyroid bed within normal limits to palpation.  Parotid glands and submandibular glands equal bilaterally without mass.  Trachea is midline. Neuro:  CN 2-12 grossly intact.   Procedure: Bilateral cerumen disimpaction Anesthesia: None Description: Under the operating microscope, the cerumen is carefully removed with a combination of cerumen currette, alligator forceps, and suction catheters.  After the cerumen is removed, the TMs are noted to be normal.  No mass,  erythema, or lesions. The patient tolerated the procedure well.    Assessment: 1.  Incidental finding of bilateral cerumen impaction.  After the disimpaction procedure, both tympanic membranes and middle ear spaces are noted to be normal. 2.  Subjectively stable bilateral high-frequency sensorineural hearing loss, likely secondary to presbycusis. 3.  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  Plan: 1.  Otomicroscopy with bilateral cerumen disimpaction. 2.  The physical exam findings are reviewed with the patient. 3.  The patient is a candidate for hearing amplification. 4.  The strategies to cope with tinnitus are discussed. 5.  The patient will return for reevaluation in 1 year, sooner if needed.

## 2024-05-07 DIAGNOSIS — F411 Generalized anxiety disorder: Secondary | ICD-10-CM | POA: Diagnosis not present

## 2024-05-21 DIAGNOSIS — F411 Generalized anxiety disorder: Secondary | ICD-10-CM | POA: Diagnosis not present

## 2024-05-28 DIAGNOSIS — F411 Generalized anxiety disorder: Secondary | ICD-10-CM | POA: Diagnosis not present

## 2024-06-04 DIAGNOSIS — F411 Generalized anxiety disorder: Secondary | ICD-10-CM | POA: Diagnosis not present

## 2024-06-11 DIAGNOSIS — F411 Generalized anxiety disorder: Secondary | ICD-10-CM | POA: Diagnosis not present

## 2024-06-17 ENCOUNTER — Encounter: Payer: Self-pay | Admitting: Podiatry

## 2024-06-17 ENCOUNTER — Ambulatory Visit: Admitting: Podiatry

## 2024-06-17 DIAGNOSIS — B351 Tinea unguium: Secondary | ICD-10-CM

## 2024-06-17 DIAGNOSIS — M79674 Pain in right toe(s): Secondary | ICD-10-CM

## 2024-06-17 DIAGNOSIS — M79675 Pain in left toe(s): Secondary | ICD-10-CM

## 2024-06-17 DIAGNOSIS — G629 Polyneuropathy, unspecified: Secondary | ICD-10-CM | POA: Diagnosis not present

## 2024-06-17 DIAGNOSIS — L84 Corns and callosities: Secondary | ICD-10-CM

## 2024-06-17 NOTE — Progress Notes (Signed)
 Subjective:   Patient ID: Scott Johnson, male   DOB: 78 y.o.   MRN: [id number]   HPI Stated he has been developing a lot of tingling and burning in both of his feet and he has severe lesions on both feet that he cannot trim himself with history of ulceration.  Patient also has nail disease 1-5 both feet that are thick and he cannot cut himself   ROS      Objective:  Physical Exam  Vascular status was found to be intact neurologically moderate diminishment sharp dull vibratory with significant forefoot structural malalignment and chronic keratotic lesion subfifth metatarsal bilateral third digit bilateral painful when pressed with thick yellow brittle nailbeds 1-5 both feet that he is not able to cut     Assessment:  Neuropathy like condition with burning tingling which may be progressing or may stabilize with keratotic lesion subfifth metatarsal head bilateral and digits that are painful with thick yellow brittle nailbeds 1-5 both feet     Plan:  H&P reviewed conditions discussed neuropathy and if there is progression we will need to consider oral medications.  At this point I debrided the lesions on fifth metatarsal bilateral with no iatrogenic bleeding and I debrided nailbeds 1-5 both feet no iatrogenic bleeding reappoint routine care     Patient

## 2024-06-18 DIAGNOSIS — F411 Generalized anxiety disorder: Secondary | ICD-10-CM | POA: Diagnosis not present

## 2024-06-28 DIAGNOSIS — Z08 Encounter for follow-up examination after completed treatment for malignant neoplasm: Secondary | ICD-10-CM | POA: Diagnosis not present

## 2024-06-28 DIAGNOSIS — L218 Other seborrheic dermatitis: Secondary | ICD-10-CM | POA: Diagnosis not present

## 2024-06-28 DIAGNOSIS — L821 Other seborrheic keratosis: Secondary | ICD-10-CM | POA: Diagnosis not present

## 2024-06-28 DIAGNOSIS — Z85828 Personal history of other malignant neoplasm of skin: Secondary | ICD-10-CM | POA: Diagnosis not present

## 2024-06-28 DIAGNOSIS — L814 Other melanin hyperpigmentation: Secondary | ICD-10-CM | POA: Diagnosis not present

## 2024-06-30 DIAGNOSIS — R051 Acute cough: Secondary | ICD-10-CM | POA: Diagnosis not present

## 2024-06-30 DIAGNOSIS — Z03818 Encounter for observation for suspected exposure to other biological agents ruled out: Secondary | ICD-10-CM | POA: Diagnosis not present

## 2024-06-30 DIAGNOSIS — R5381 Other malaise: Secondary | ICD-10-CM | POA: Diagnosis not present

## 2024-06-30 DIAGNOSIS — J3489 Other specified disorders of nose and nasal sinuses: Secondary | ICD-10-CM | POA: Diagnosis not present

## 2024-07-02 DIAGNOSIS — F411 Generalized anxiety disorder: Secondary | ICD-10-CM | POA: Diagnosis not present

## 2024-07-09 DIAGNOSIS — F411 Generalized anxiety disorder: Secondary | ICD-10-CM | POA: Diagnosis not present

## 2024-07-16 DIAGNOSIS — F411 Generalized anxiety disorder: Secondary | ICD-10-CM | POA: Diagnosis not present

## 2024-07-23 DIAGNOSIS — F411 Generalized anxiety disorder: Secondary | ICD-10-CM | POA: Diagnosis not present

## 2024-07-30 DIAGNOSIS — F411 Generalized anxiety disorder: Secondary | ICD-10-CM | POA: Diagnosis not present

## 2024-08-06 DIAGNOSIS — F411 Generalized anxiety disorder: Secondary | ICD-10-CM | POA: Diagnosis not present

## 2024-08-13 DIAGNOSIS — F411 Generalized anxiety disorder: Secondary | ICD-10-CM | POA: Diagnosis not present

## 2024-08-20 DIAGNOSIS — F411 Generalized anxiety disorder: Secondary | ICD-10-CM | POA: Diagnosis not present

## 2024-08-27 DIAGNOSIS — F411 Generalized anxiety disorder: Secondary | ICD-10-CM | POA: Diagnosis not present

## 2024-09-03 DIAGNOSIS — F411 Generalized anxiety disorder: Secondary | ICD-10-CM | POA: Diagnosis not present

## 2024-09-10 DIAGNOSIS — F411 Generalized anxiety disorder: Secondary | ICD-10-CM | POA: Diagnosis not present

## 2024-09-17 DIAGNOSIS — F411 Generalized anxiety disorder: Secondary | ICD-10-CM | POA: Diagnosis not present

## 2024-09-20 ENCOUNTER — Ambulatory Visit: Admitting: Podiatry

## 2024-09-20 ENCOUNTER — Encounter: Payer: Self-pay | Admitting: Podiatry

## 2024-09-20 DIAGNOSIS — G629 Polyneuropathy, unspecified: Secondary | ICD-10-CM

## 2024-09-20 DIAGNOSIS — L84 Corns and callosities: Secondary | ICD-10-CM

## 2024-09-20 DIAGNOSIS — M79674 Pain in right toe(s): Secondary | ICD-10-CM | POA: Diagnosis not present

## 2024-09-20 DIAGNOSIS — B351 Tinea unguium: Secondary | ICD-10-CM | POA: Diagnosis not present

## 2024-09-20 DIAGNOSIS — M79675 Pain in left toe(s): Secondary | ICD-10-CM

## 2024-09-20 NOTE — Progress Notes (Signed)
 Subjective:   Patient ID: Scott Johnson, male   DOB: 78 y.o.   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

## 2024-09-24 DIAGNOSIS — F411 Generalized anxiety disorder: Secondary | ICD-10-CM | POA: Diagnosis not present

## 2024-12-20 ENCOUNTER — Ambulatory Visit: Admitting: Podiatry
